# Patient Record
Sex: Female | Born: 1945 | Race: Black or African American | Hispanic: No | Marital: Single | Smoking: Never smoker
Health system: Southern US, Community
[De-identification: ages and names within clinical notes are randomized; demographics above are authoritative.]

## PROBLEM LIST (undated history)

## (undated) DIAGNOSIS — M1990 Unspecified osteoarthritis, unspecified site: Secondary | ICD-10-CM

## (undated) DIAGNOSIS — Z973 Presence of spectacles and contact lenses: Secondary | ICD-10-CM

## (undated) DIAGNOSIS — I10 Essential (primary) hypertension: Secondary | ICD-10-CM

## (undated) DIAGNOSIS — M79672 Pain in left foot: Secondary | ICD-10-CM

## (undated) DIAGNOSIS — Z8679 Personal history of other diseases of the circulatory system: Secondary | ICD-10-CM

## (undated) DIAGNOSIS — R55 Syncope and collapse: Secondary | ICD-10-CM

## (undated) DIAGNOSIS — I471 Supraventricular tachycardia: Secondary | ICD-10-CM

## (undated) DIAGNOSIS — I739 Peripheral vascular disease, unspecified: Secondary | ICD-10-CM

## (undated) DIAGNOSIS — Z87898 Personal history of other specified conditions: Secondary | ICD-10-CM

## (undated) DIAGNOSIS — Z8489 Family history of other specified conditions: Secondary | ICD-10-CM

## (undated) DIAGNOSIS — Z972 Presence of dental prosthetic device (complete) (partial): Secondary | ICD-10-CM

## (undated) DIAGNOSIS — Z86010 Personal history of colonic polyps: Secondary | ICD-10-CM

## (undated) DIAGNOSIS — I4719 Other supraventricular tachycardia: Secondary | ICD-10-CM

## (undated) DIAGNOSIS — J302 Other seasonal allergic rhinitis: Secondary | ICD-10-CM

## (undated) HISTORY — DX: Supraventricular tachycardia: I47.1

## (undated) HISTORY — DX: Syncope and collapse: R55

## (undated) HISTORY — PX: BREAST CYST EXCISION: SHX579

## (undated) HISTORY — DX: Essential (primary) hypertension: I10

## (undated) HISTORY — PX: COLONOSCOPY: SHX174

---
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
## 1999-10-18 HISTORY — PX: ABDOMINAL HYSTERECTOMY: SHX81

## 2002-11-04 ENCOUNTER — Emergency Department (HOSPITAL_COMMUNITY): Admission: EM | Admit: 2002-11-04 | Discharge: 2002-11-04 | Payer: Self-pay

## 2008-10-17 DIAGNOSIS — Z8679 Personal history of other diseases of the circulatory system: Secondary | ICD-10-CM

## 2008-10-17 HISTORY — DX: Personal history of other diseases of the circulatory system: Z86.79

## 2009-06-08 ENCOUNTER — Inpatient Hospital Stay (HOSPITAL_COMMUNITY): Admission: EM | Admit: 2009-06-08 | Discharge: 2009-06-10 | Payer: Self-pay | Admitting: Emergency Medicine

## 2009-06-08 ENCOUNTER — Ambulatory Visit: Payer: Self-pay | Admitting: Cardiology

## 2009-06-09 ENCOUNTER — Encounter: Payer: Self-pay | Admitting: Internal Medicine

## 2009-06-09 ENCOUNTER — Encounter: Payer: Self-pay | Admitting: Cardiology

## 2009-06-10 ENCOUNTER — Ambulatory Visit: Payer: Self-pay | Admitting: Internal Medicine

## 2009-06-11 ENCOUNTER — Telehealth (INDEPENDENT_AMBULATORY_CARE_PROVIDER_SITE_OTHER): Payer: Self-pay | Admitting: *Deleted

## 2009-06-12 ENCOUNTER — Ambulatory Visit: Payer: Self-pay | Admitting: Internal Medicine

## 2009-06-12 DIAGNOSIS — I498 Other specified cardiac arrhythmias: Secondary | ICD-10-CM

## 2009-06-12 DIAGNOSIS — R55 Syncope and collapse: Secondary | ICD-10-CM

## 2009-06-12 DIAGNOSIS — I10 Essential (primary) hypertension: Secondary | ICD-10-CM | POA: Insufficient documentation

## 2009-06-12 DIAGNOSIS — R002 Palpitations: Secondary | ICD-10-CM | POA: Insufficient documentation

## 2009-06-15 ENCOUNTER — Telehealth (INDEPENDENT_AMBULATORY_CARE_PROVIDER_SITE_OTHER): Payer: Self-pay

## 2009-06-16 ENCOUNTER — Ambulatory Visit: Payer: Self-pay

## 2009-06-16 ENCOUNTER — Encounter: Payer: Self-pay | Admitting: Cardiovascular Disease

## 2009-06-17 ENCOUNTER — Ambulatory Visit: Payer: Self-pay | Admitting: Internal Medicine

## 2009-07-09 ENCOUNTER — Ambulatory Visit: Payer: Self-pay | Admitting: Internal Medicine

## 2009-09-14 ENCOUNTER — Telehealth: Payer: Self-pay | Admitting: Internal Medicine

## 2009-09-15 ENCOUNTER — Telehealth (INDEPENDENT_AMBULATORY_CARE_PROVIDER_SITE_OTHER): Payer: Self-pay | Admitting: *Deleted

## 2009-09-21 ENCOUNTER — Telehealth: Payer: Self-pay | Admitting: Internal Medicine

## 2009-11-05 ENCOUNTER — Telehealth: Payer: Self-pay | Admitting: Internal Medicine

## 2009-12-29 ENCOUNTER — Telehealth: Payer: Self-pay | Admitting: Internal Medicine

## 2010-01-21 ENCOUNTER — Telehealth: Payer: Self-pay | Admitting: Internal Medicine

## 2010-03-02 ENCOUNTER — Telehealth: Payer: Self-pay | Admitting: Internal Medicine

## 2010-03-10 ENCOUNTER — Ambulatory Visit: Payer: Self-pay | Admitting: Internal Medicine

## 2010-03-30 ENCOUNTER — Telehealth (INDEPENDENT_AMBULATORY_CARE_PROVIDER_SITE_OTHER): Payer: Self-pay | Admitting: *Deleted

## 2010-04-07 ENCOUNTER — Ambulatory Visit: Payer: Self-pay | Admitting: Internal Medicine

## 2010-08-20 ENCOUNTER — Telehealth: Payer: Self-pay | Admitting: Internal Medicine

## 2010-10-22 ENCOUNTER — Ambulatory Visit
Admission: RE | Admit: 2010-10-22 | Discharge: 2010-10-22 | Payer: Self-pay | Source: Home / Self Care | Attending: Internal Medicine | Admitting: Internal Medicine

## 2010-10-22 ENCOUNTER — Encounter: Payer: Self-pay | Admitting: Internal Medicine

## 2010-11-16 NOTE — Progress Notes (Signed)
Summary: pt rtn call from [facility]  Phone Note Call from Patient Call back at [facility] Phone [phone] Call back at [phone]   Caller: Patient Reason for Call: Talk to Nurse, Talk to Doctor Summary of Call: pt rtn call to [facility] and she will be at home number for the next and then call her at other number listed above Initial call taken by: Omer Jack,  August 20, 2010 9:08 AM  Follow-up for Phone Call        Pt. called  for Dr. Hillery Jacks to authorize a medication . Pt. is taken Toprol XL 50 mg once a day. A prescription was send  to [facility] Toprol XL 50 mg once daily. Pt. aware. Follow-up by: Ollen Gross, RN, BSN,  August 20, 2010 11:20 AM    Prescriptions: TOPROL XL 50 MG XR24H-TAB (METOPROLOL SUCCINATE) take 1 tablets qd  #30 x 8   Entered by:   Ollen Gross, RN, BSN   Authorized by:   Hillis Range, MD   Signed by:   Ollen Gross, RN, BSN on 08/20/2010   Method used:   Electronically to        [facility]* (retail)       [address]       [place], [place]  [address]       Ph: [phone]       Fax: [phone]   RxID:   [id number]

## 2010-11-16 NOTE — Assessment & Plan Note (Signed)
Summary: rov per pt call/lg   Visit Type:  Follow-up Primary Provider:  Dr Tommas Olp   History of Present Illness: The patient presents today for routine electrophysiology followup. She reports doing very well since last being seen in our clinic.  She has returned to normal activities. The patient denies any symptoms of palpitations, chest pain, shortness of breath, orthopnea, PND, lower extremity edema, dizziness, presyncope, syncope, or neurologic sequela. She is not compliant with flecainide.  The patient is tolerating other medications without difficulties and is otherwise without complaint today.   Current Medications (verified): 1)  Aspirin 81 Mg Tbec (Aspirin) .... Take One Tablet By Mouth Daily 2)  Toprol Xl 50 Mg Xr24h-Tab (Metoprolol Succinate) .... Take 1 Tablets Qd 3)  Amlodipine Besylate 5 Mg Tabs (Amlodipine Besylate) .... Once Daily 4)  Vitamin C Cr 500 Mg Cr-Caps (Ascorbic Acid) .... Once Daily 5)  Vitamin D 1000 Unit  Tabs (Cholecalciferol) .... Once Daily 6)  Flecainide Acetate 100 Mg Tabs (Flecainide Acetate) .... One By Mouth Once Daily 7)  Bromelain 375 Mg Caps (Bromelains) .... Take One Tablet By Mouth Once Daily. 8)  Saline Nasal Spray 0.65 % Soln (Saline) .... As Needed  Allergies (verified): No Known Drug Allergies  Past History:  Past Medical History: Reviewed history from 06/17/2009 and no changes required. SUPRAVENTRICULAR TACHYCARDIA (ICD-427.89)- Atach HYPERTENSION (ICD-401.9) SYNCOPE (ICD-780.2)  Past Surgical History: Reviewed history from 06/12/2009 and no changes required.  Status post total vaginal hysterectomy and BSO.      Social History: Reviewed history from 06/12/2009 and no changes required.  She lives in [place] with the daughter.  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  The patient  denies tobacco, alcohol, or drug use.  She does exercise 3 days a week  using elliptical and stationary bicycle or  sometimes treadmill.  She  also reports that she does ab work.   Review of Systems       All systems are reviewed and negative except as listed in the HPI.   Vital Signs:  Patient profile:   65 year old female Height:      67 inches Weight:      164 pounds BMI:     25.78 Pulse rate:   112 / minute BP supine:   122 / 78  Vitals Entered By: Laurance Flatten CMA (Mar 10, 2010 2:26 PM)  Physical Exam  General:  Well developed, well nourished, in no acute distress. Head:  normocephalic and atraumatic Eyes:  PERRLA/EOM intact; conjunctiva and lids normal. Mouth:  Teeth, gums and palate normal. Oral mucosa normal. Neck:  Neck supple, no JVD. No masses, thyromegaly or abnormal cervical nodes. Lungs:  Clear bilaterally to auscultation and percussion. Heart:  tachy regular rhythm , no m/r/g Abdomen:  Bowel sounds positive; abdomen soft and non-tender without masses, organomegaly, or hernias noted. No hepatosplenomegaly. Msk:  Back normal, normal gait. Muscle strength and tone normal. Pulses:  pulses normal in all 4 extremities Extremities:  No clubbing or cyanosis. Neurologic:  Alert and oriented x 3. Skin:  Intact without lesions or rashes. Cervical Nodes:  no significant adenopathy Psych:  Normal affect.  Echocardiogram  Procedure date:  06/09/2009  Findings:       Study Conclusions    1. Left ventricle: The cavity size was mildly dilated. Wall      thickness was normal. The estimated ejection fraction was 50%.      Diffuse  hypokinesis.   2. Aortic valve: Trivial regurgitation.   3. Mitral valve: Mild regurgitation.   4. Left atrium: The atrium was mildly dilated.   5. Right atrium: The atrium was mildly dilated.   6. Atrial septum: No defect or patent foramen ovale was identified.   7. Pulmonary arteries: PA peak pressure: 32mm Hg (S).   Echocardiography. M-mode, complete 2D, spectral Doppler, and color   Doppler. Height: Height: 170.2cm. Height: 67in. Weight: Weight:    68.3kg. Weight: 150.3lb. Body mass index: BMI: 23.6kg/m^2. Body   surface area: BSA: 1.64m^2. Patient status: Inpatient. Location:   Bedside.    EKG  Procedure date:  03/10/2010  Findings:      ectopic atrial tachycardia CL ,  P waves negative in V1, positive in I, II, III, VF  Impression & Recommendations:  Problem # 1:  SUPRAVENTRICULAR TACHYCARDIA (ICD-427.89) The patient presents today in EAT.  She is minimally symptomatic and HR are not significantly elevated.  Unfortunately, she is not compliant with flecainide.  Therapeutic strategies for atrial tachycardia including medicine and ablation were discussed in detail with the patient today. Risk, benefits, and alternatives to EP study and radiofrequency ablation  were also discussed in detail today.  The patient wishes to continue medical therapy at this time.  She reports that she will take her flecainide as prescribed.  She will also take metoprolol for rate control and return for reassessment in 4 wks.  Problem # 2:  HYPERTENSION (ICD-401.9) stable no changes Her updated medication list for this problem includes:    Aspirin 81 Mg Tbec (Aspirin) .Marland Kitchen... Take one tablet by mouth daily    Toprol Xl 50 Mg Xr24h-tab (Metoprolol succinate) .Marland Kitchen... Take 1 tablets qd    Amlodipine Besylate 5 Mg Tabs (Amlodipine besylate) ..... Once daily  Problem # 3:  SYNCOPE (ICD-780.2) no further episodes  Patient Instructions: 1)  Your physician recommends that you schedule a follow-up appointment in: 4 weeks with Dr Johney Frame

## 2010-11-16 NOTE — Progress Notes (Signed)
  Walk in Patient Form Recieved " Pt left papers from [facility]" sent to [facility]  March 30, 2010 8:45 AM

## 2010-11-16 NOTE — Progress Notes (Signed)
Summary: life [employer] letter <lmom>  Phone Note Call from Patient Call back at Home Phone [phone]   Caller: Patient Summary of Call: pt calling about a letter from [employer] Initial call taken by: Migdalia Dk,  December 29, 2009 11:55 AM  Follow-up for Phone Call        Called patient and left message on machine to call back  Sander Nephew, RN 12:04 pm 12/29/2009  returning call, Migdalia Dk  December 29, 2009 12:20 PM   pt received 2nd letter stating monitor was not covered, wanted [place] to be aware she will have to pay, will let Tresa Endo and Dr Johney Frame know Meredith Staggers, RN  December 30, 2009 9:28 AM   Additional Follow-up for Phone Call Additional follow up Details #1::        Called and left pt a message that our office was getting in contact with [employer] and do not pay until she hears back from [place]  Dennis Bast, RN, BSN  December 30, 2009 1:05 PM

## 2010-11-16 NOTE — Assessment & Plan Note (Signed)
Summary: 11:45/ok per nurse/per check out/sf   Primary Provider:  Dr Tommas Olp  CC:  no complaints.  History of Present Illness: The patient presents today for routine electrophysiology followup. She reports doing very well since last being seen in our clinic.  She has returned to normal activities. The patient denies any symptoms of palpitations, chest pain, shortness of breath, orthopnea, PND, lower extremity edema, dizziness, presyncope, syncope, or neurologic sequela. She has resumed flecainide. The patient is tolerating other medications without difficulties and is otherwise without complaint today.   Current Medications (verified): 1)  Aspirin 81 Mg Tbec (Aspirin) .... Take One Tablet By Mouth Daily 2)  Toprol Xl 50 Mg Xr24h-Tab (Metoprolol Succinate) .... Take 1 Tablets Qd 3)  Amlodipine Besylate 5 Mg Tabs (Amlodipine Besylate) .... Once Daily 4)  Vitamin C Cr 500 Mg Cr-Caps (Ascorbic Acid) .... Once Daily 5)  Vitamin D 1000 Unit  Tabs (Cholecalciferol) .... Once Daily 6)  Flecainide Acetate 100 Mg Tabs (Flecainide Acetate) .... One By Mouth Two Times A Day 7)  Bromelain 375 Mg Caps (Bromelains) .... Take One Tablet By Mouth Once Daily. 8)  Saline Nasal Spray 0.65 % Soln (Saline) .... As Needed  Allergies (verified): No Known Drug Allergies  Past History:  Past Medical History: Reviewed history from 06/17/2009 and no changes required. SUPRAVENTRICULAR TACHYCARDIA (ICD-427.89)- Atach HYPERTENSION (ICD-401.9) SYNCOPE (ICD-780.2)  Past Surgical History: Reviewed history from 06/12/2009 and no changes required.  Status post total vaginal hysterectomy and BSO.      Vital Signs:  Patient profile:   65 year old female Height:      67 inches Weight:      169 pounds BMI:     26.56 Pulse rate:   56 / minute Resp:     16 per minute BP sitting:   132 / 76  (left arm)  Vitals Entered By: Marrion Coy, CNA (April 07, 2010 11:28 AM)  Physical Exam  General:  Well  developed, well nourished, in no acute distress. Head:  normocephalic and atraumatic Eyes:  PERRLA/EOM intact; conjunctiva and lids normal. Mouth:  Teeth, gums and palate normal. Oral mucosa normal. Neck:  Neck supple, no JVD. No masses, thyromegaly or abnormal cervical nodes. Lungs:  Clear bilaterally to auscultation and percussion. Heart:  Non-displaced PMI, chest non-tender; regular rate and rhythm, S1, S2 without murmurs, rubs or gallops. Carotid upstroke normal, no bruit. Normal abdominal aortic size, no bruits. Femorals normal pulses, no bruits. Pedals normal pulses. No edema, no varicosities. Abdomen:  Bowel sounds positive; abdomen soft and non-tender without masses, organomegaly, or hernias noted. No hepatosplenomegaly. Msk:  Back normal, normal gait. Muscle strength and tone normal. Pulses:  pulses normal in all 4 extremities Extremities:  No clubbing or cyanosis. Neurologic:  Alert and oriented x 3.   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  She does not wish to proceed with ablation at this time.  We will continue medical therapy at this time.  Problem # 2:  HYPERTENSION (ICD-401.9) stable no changes  Patient Instructions: 1)  Return in 6 months

## 2010-11-16 NOTE — Progress Notes (Signed)
Summary: CALLING ABOUT [employer] BILL  Phone Note Call from Patient Call back at Home Phone [phone]   Caller: Patient Summary of Call: PT CALLING REGARDING THE [employer] MONITOR. Initial call taken by: Judie Grieve,  November 05, 2009 3:23 PM  Follow-up for Phone Call        all info was sent to [facility] via fax on 10/28/09. LMOM for pt regarding information.  She can call back with more questions if she would like Dennis Bast, RN, BSN  November 05, 2009 4:06 PM

## 2010-11-16 NOTE — Progress Notes (Signed)
Summary: pt has form from [employer]...[facility] TCB///js  Phone Note Call from Patient Call back at Home Phone [phone]   Caller: Patient Reason for Call: Talk to Nurse, Talk to Doctor Summary of Call: pt has paperwork from [employer] and she wants to talk to nurse proir to filling the form out because it has been so much trouble Initial call taken by: Omer Jack,  January 21, 2010 1:59 PM  Follow-up for Phone Call        [facility] TCB Follow-up by: Minerva Areola, RN, BSN,  January 21, 2010 4:46 PM  Additional Follow-up for Phone Call Additional follow up Details #1::        returning call, Migdalia Dk  January 22, 2010 9:38 AM  lmom for pt mail me the forms and I would fill out Dennis Bast, RN, BSN  January 22, 2010 4:41 PM

## 2010-11-16 NOTE — Progress Notes (Signed)
Summary: regarding letter, insurance- life [employer]  Phone Note Call from Patient Call back at [facility] Phone [phone]   Caller: Patient Reason for Call: Talk to Nurse Summary of Call: regarding a letter- insurance- and life watch.  Initial call taken by: Lorne Skeens,  Mar 02, 2010 9:02 AM  Follow-up for Phone Call        want her policy information and she doesn't card.  Got copy of card and will mail to pt today.  Pt aware Dennis Bast, RN, BSN  Mar 02, 2010 9:23 AM

## 2010-11-18 NOTE — Assessment & Plan Note (Signed)
Summary: 6 month rov/sl   Visit Type:  Follow-up Primary Provider:  Dr Tommas Olp  CC:  no cardiac complaints.  History of Present Illness: The patient presents today for routine electrophysiology followup. She reports doing very well since last being seen in our clinic.   The patient denies any symptoms of palpitations, chest pain, shortness of breath, orthopnea, PND, lower extremity edema, dizziness, presyncope, syncope, or neurologic sequela. She has resumed flecainide. She reports occasional fatigue and sleepiness with toprol.  The patient is otherwise without complaint today.   Current Medications (verified): 1)  Aspirin 81 Mg Tbec (Aspirin) .... Take One Tablet By Mouth Daily 2)  Toprol Xl 50 Mg Xr24h-Tab (Metoprolol Succinate) .... Take 1 Tablets Qd 3)  Amlodipine Besylate 5 Mg Tabs (Amlodipine Besylate) .... Once Daily 4)  Vitamin C Cr 500 Mg Cr-Caps (Ascorbic Acid) .... Once Daily 5)  Vitamin D 1000 Unit  Tabs (Cholecalciferol) .... Once Daily 6)  Flecainide Acetate 100 Mg Tabs (Flecainide Acetate) .... One By Mouth Two Times A Day 7)  Bromelain 375 Mg Caps (Bromelains) .... Take One Tablet By Mouth Once Daily. 8)  Saline Nasal Spray 0.65 % Soln (Saline) .... As Needed  Allergies: No Known Drug Allergies  Past History:  Past Medical History: Reviewed history from 06/17/2009 and no changes required. SUPRAVENTRICULAR TACHYCARDIA (ICD-427.89)- Atach HYPERTENSION (ICD-401.9) SYNCOPE (ICD-780.2)  Past Surgical History: Reviewed history from 06/12/2009 and no changes required.  Status post total vaginal hysterectomy and BSO.      Social History: Reviewed history from 06/12/2009 and no changes required.  She lives in [place] with the daughter.  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  The patient  denies tobacco, alcohol, or drug use.  She does exercise 3 days a week  using elliptical and stationary bicycle or sometimes treadmill.  She   also reports that she does ab work.   Review of Systems       All systems are reviewed and negative except as listed in the HPI.   Vital Signs:  Patient profile:   65 year old female Height:      67 inches Weight:      171.50 pounds BMI:     26.96 Pulse rate:   61 / minute Pulse rhythm:   regular BP sitting:   132 / 72  (left arm) Cuff size:   regular  Vitals Entered By: Scherrie Bateman, LPN (October 22, 2010 10:54 AM)  Physical Exam  General:  Well developed, well nourished, in no acute distress. Head:  normocephalic and atraumatic Eyes:  PERRLA/EOM intact; conjunctiva and lids normal. Mouth:  Teeth, gums and palate normal. Oral mucosa normal. Neck:  Neck supple, no JVD. No masses, thyromegaly or abnormal cervical nodes. Lungs:  Clear bilaterally to auscultation and percussion. Heart:  Non-displaced PMI, chest non-tender; regular rate and rhythm, S1, S2 without murmurs, rubs or gallops. Carotid upstroke normal, no bruit. Normal abdominal aortic size, no bruits. Femorals normal pulses, no bruits. Pedals normal pulses. No edema, no varicosities. Abdomen:  Bowel sounds positive; abdomen soft and non-tender without masses, organomegaly, or hernias noted. No hepatosplenomegaly. Msk:  Back normal, normal gait. Muscle strength and tone normal. Extremities:  No clubbing or cyanosis. Neurologic:  Alert and oriented x 3. Skin:  Intact without lesions or rashes. Psych:  Normal affect.   EKG  Procedure date:  10/22/2010  Findings:      sinus rhythm 60 bpm,  PR 168. 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  She does not wish to proceed with ablation at this time.  We will continue medical therapy at this time.  I have decreased toprol XL to 25mg  daily.   Problem # 2:  HYPERTENSION (ICD-401.9) stable  Problem # 3:   SYNCOPE (ICD-780.2) no further episodes  Other Orders: EKG w/ Interpretation (93000)  Patient Instructions: 1)  Your physician recommends that you schedule a follow-up appointment in: 6 MONTHS WITH DR Johney Frame 2)  Your physician has recommended you make the following change in your medication: DECREASE TOPROL TO 25 MG once daily  3)  TAKE 1/2 TAB

## 2011-01-22 LAB — COMPREHENSIVE METABOLIC PANEL
ALT: 25 U/L (ref 0–35)
AST: 43 U/L — ABNORMAL HIGH (ref 0–37)
Albumin: 3.3 g/dL — ABNORMAL LOW (ref 3.5–5.2)
CO2: 29 mEq/L (ref 19–32)
Calcium: 9 mg/dL (ref 8.4–10.5)
Chloride: 110 mEq/L (ref 96–112)
GFR calc Af Amer: >60 mL/min (ref >60)
GFR calc non Af Amer: 57 mL/min — ABNORMAL LOW (ref >60)
Sodium: 142 mEq/L (ref 135–145)

## 2011-01-22 LAB — BASIC METABOLIC PANEL
BUN: 7 mg/dL (ref 6–23)
CO2: 27 mEq/L (ref 19–32)
Chloride: 106 mEq/L (ref 96–112)
Creatinine, Ser: 1.15 mg/dL (ref 0.4–1.2)
GFR calc Af Amer: 51 mL/min — ABNORMAL LOW (ref >60)
GFR calc non Af Amer: 43 mL/min — ABNORMAL LOW (ref >60)
Glucose, Bld: 85 mg/dL (ref 70–99)
Potassium: 4.1 mEq/L (ref 3.5–5.1)
Sodium: 136 mEq/L (ref 135–145)

## 2011-01-22 LAB — CBC
MCHC: 33.5 g/dL (ref 30.0–36.0)
MCV: 88.9 fL (ref 78.0–100.0)
Platelets: 219 10*3/uL (ref 150–400)
RDW: 14.6 % (ref 11.5–15.5)

## 2011-01-22 LAB — POCT CARDIAC MARKERS
CKMB, poc: 17.9 ng/mL (ref 1.0–8.0)
Troponin i, poc: <0.05 ng/mL (ref 0.00–0.09)

## 2011-01-22 LAB — PROTIME-INR
INR: 1.1 (ref 0.00–1.49)
Prothrombin Time: 13.6 seconds (ref 11.6–15.2)

## 2011-01-22 LAB — CARDIAC PANEL(CRET KIN+CKTOT+MB+TROPI)
Relative Index: 1.3 (ref 0.0–2.5)
Relative Index: 1.5 (ref 0.0–2.5)
Total CK: 926 U/L — ABNORMAL HIGH (ref 7–177)

## 2011-01-22 LAB — DIFFERENTIAL
Basophils Relative: 0 % (ref 0–1)
Eosinophils Absolute: 0.1 10*3/uL (ref 0.0–0.7)
Neutrophils Relative %: 60 % (ref 43–77)

## 2011-01-22 LAB — CK TOTAL AND CKMB (NOT AT ARMC)
CK, MB: 32.6 ng/mL — ABNORMAL HIGH (ref 0.3–4.0)
Total CK: 1344 U/L — ABNORMAL HIGH (ref 7–177)

## 2011-03-01 NOTE — Discharge Summary (Signed)
NAME:  [name] [name]            ACCOUNT NO.:  [id number]   MEDICAL RECORD NO.:  [id number]          PATIENT TYPE:  INP   LOCATION:  2030                         FACILITY:  [facility]   PHYSICIAN:  Hillis Range, MD       DATE OF BIRTH:  Jun 05, 1946   DATE OF ADMISSION:  06/08/2009  DATE OF DISCHARGE:  06/10/2009                               DISCHARGE SUMMARY   This patient has no known drug allergies.   FINAL DIAGNOSES:  1. Admitted with syncope.  2. Telemetry at [facility] shows frequent premature atrial      contractions, frequent premature ventricular contractions and      paroxysmal long RP tachycardia.  3. The patient's cardiac dysrhythmias suppressed with Toprol succinate      75 mg daily.  4. Admitted with elevated blood pressures, Norvasc 5 mg daily added      for blood pressure control.  5. A 2-D echocardiogram on June 09, 2009, ejection fraction 50% mild      mitral regurgitation, mild tricuspid regurgitation.   SECONDARY DIAGNOSES:  1. Status post total abdominal hysterectomy.  2. Hypertension.   PROCEDURES THIS ADMISSION:  None.  The patient had medical management of  her supraventricular tachycardia.   Also, because of history of syncope, the patient is advised not to drive  for the next 6 months.   For brief history, [name] [name] is a 65 year old female.  She recently  increased her exercise regimen, but notes that she has had more easy  fatigability lately.   On Sunday, August 23, the patient felt anorexic, this is not her usual  state, lately she has been having hot flashes breaking out in sweats  without accompanying increase in activity.  The patient is status post  hysterectomy.   Monday morning, August 24, the patient was getting ready for work.  In  the shower, she felt nauseated and the need to evacuate.  After passing  a loose stool, she went downstairs for breakfast.  As she was preparing  after breakfast to go back upstairs, she felt  clammy and woozy.  She sat  down for a while to recover and the next thing she remembers is  awakening upstairs, having fallen from the stairwell into her daughter's  room.  The patient does not elicit any history of palpitations, chest  pain, or dyspnea.  After the fall, she reports feeling weak.  Her  daughter was awakened by the fall and noted that the patient had no  postictal symptoms, was not particularly confused.   Later in the day, the patient went to Urgent Care.  Electrocardiogram  showed frequent PACs and PVCs.  The patient was sent to the emergency  room.  In the emergency room, her daughter who is at the examination  during this consult saw the patient's monitor vacillate from 60-150  beats per minute.  Monitors here show SVT, a long, RP tachycardia, this  is paroxysmal.  She also has ventricular bigeminy, frequent PACs, and  frequent PVCs.  She has had some suppression of ectopy on Toprol 50 mg  daily.  The  patient prior to this admission was on 25 mg daily.  She was  also hypertensive on presentation and has been started on Norvasc.   HOSPITAL COURSE:  The patient admitted on August 24 with syncope.  The  patient had frequent ectopy on monitor here as well as hypertension.  Her ectopy has been suppressed by increasing Toprol in a staged fashion  to the point where she is taking now 75 mg daily, and has no PACs or  PVCs and just an occasional paroxysmal occurrence of her long RP  tachycardia.  The patient had cardiac enzymes taken.  They were negative  x3.  Her TSH is within normal limits at 1.350.  She had a 2-D  echocardiogram, which showed ejection fraction 50%, mild tricuspid  regurgitation.  She had orthostatic blood pressure measurements taken,  which showed no decrease from lying to standing on her blood pressure.  The patient during her episodes of SVT is relatively asymptomatic.  She  is not aware of palpitations, chest discomfort, or shortness of breath.  There  is some thought that the patient's syncope was a vasovagal event.  The patient has been once again encouraged not to drive for the next 6  months.   DISCHARGE MEDICATIONS:  1. Addition of aspirin 325 mg daily.  2. Amlodipine 5 mg daily.  3. Metoprolol succinate 75 mg daily.  4. Ascorbic acid 500 mg daily.  5. Bromaline 1 tablet daily as needed.  6. Omega-3-ethyl esters 1 g daily.  7. Vitamin D3 1000 units 2 tablets daily.  8. The patient is asked to stop taking Toprol 25 mg daily.   She has followup at [facility].  1. To pick up a [employer] monitor on her way home from the hospital      today.  2. Stress test, Tuesday, August 31 at 10 o'clock.  She is asked to eat      nothing after midnight, Monday, August 30.  3. To see Dr. Johney Frame in followup, Wednesday, September 22 at 9:30.   LABORATORY STUDIES THIS ADMISSION:  Sodium 142, potassium 3.3, which was  replenished, chloride 110, carbonate 29, BUN is 5, creatinine 0.98, and  glucose 104.  TSH 1.350.  D-dimer 1.53.  No followup CT since the  patient clinically did not warrant that her SGOT was 43, SGPT 25,  alkaline and phosphatase 67.  White cells 5.5, hemoglobin 14.4,  hematocrit 43, platelets 219.  Troponin I studies 0.04, then 0.03, and  then 0.02.      Maple Mirza, [place]      Hillis Range, MD  Electronically Signed    GM/MEDQ  D:  06/10/2009  T:  06/10/2009  Job:  [id number]   cc:   Sabino Dick, M.D.

## 2011-03-01 NOTE — Consult Note (Signed)
NAME:  [name] [name], [name] [name]            ACCOUNT NO.:  [id number]   MEDICAL RECORD NO.:  [id number]          PATIENT TYPE:  INP   LOCATION:  2030                         FACILITY:  [facility]   PHYSICIAN:  Hillis Range, MD       DATE OF BIRTH:  January 03, 1946   DATE OF CONSULTATION:  DATE OF DISCHARGE:                                 CONSULTATION   REQUESTING PHYSICIAN:  Arturo Morton. Riley Kill, MD, [facility]   REASON FOR CONSULTATION:  Supraventricular tachycardia.   HISTORY OF PRESENT ILLNESS:  [name] [name] is a pleasant 65 year old  female with a history of hypertension, who presented following a  syncopal episode.  She reports being in her usual state of health until  June 08, 2009, in the early morning when she awoke with symptoms of  nausea.  She took a shower and then started having nausea and pallor.  She did not throw up, but did have a large loose watery stool.  She  reports being very weak and clammy.  She was then ambulating up her  steps when she had a syncopal episode with brief loss of consciousness.  She denies palpitations, chest pain, or other symptoms preceding her  episode.  Upon regaining composure, she reports that her nausea,  vomiting, and abdominal discomfort had resolved.  She presented to  urgent care and was subsequently brought to [facility] for further  evaluation.  At [facility], the patient was placed on telemetry where  she was observed to have supraventricular tachycardia with heart rates  into the 150s.  She reports symptoms of similar palpitations in the  past, though these have previously been well controlled with Toprol-XL.  She presently feels at her baseline state of health.  She denies recent  episodes of chest pain, shortness of breath, orthopnea, PND, or other  concerns.   PAST MEDICAL HISTORY:  Hypertension.   PAST SURGICAL HISTORY:  Status post total abdominal hysterectomy.   ALLERGIES:  No known drug allergies.   HOME MEDICATIONS:  Toprol-XL 25 mg  daily.   CURRENT MEDICATIONS:  Reviewed in the [facility].   SOCIAL HISTORY:  The patient lives in [place] with her daughter.  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  She denies tobacco, alcohol, or drug  use.   FAMILY HISTORY:  The patient's son has had DVTs.  This has been  attributed to a potentially familial hypercoagulability, though this is  felt to have been inherited through the patient's father who also had  hypercoagulability.  The patient denies any other relatives with  hypercoagulability.  Her mother had a stroke at age 20.  Her father died  of COPD and coronary artery disease.  She is unaware of any history of  sudden death or arrhythmias.   REVIEW OF SYSTEMS:  All systems are reviewed and negative except as  outlined in the HPI above.   Telemetry reveals predominantly sinus rhythm.  The patient did have a  long RP tachycardia with heart rates into the 150s.  She has also had  premature atrial contractions and PVCs.   PHYSICAL EXAMINATION:  VITALS:  Blood pressure 115/68, heart rate 61,  respirations 18, sats 98% on room air, afebrile.  GENERAL:  The patient is a well-appearing female in no acute distress.  She is alert and oriented x3.  HEENT:  Normocephalic, atraumatic.  Sclerae clear.  Conjunctivae pink.  Oropharynx clear.  NECK:  Supple.  No thyromegaly, JVD, or bruits.  LUNGS:  Clear to auscultation bilaterally.  HEART:  Regular rate and rhythm.  No murmurs, rubs, or gallops.  GI:  Soft, nontender, nondistended.  Positive bowel sounds.  EXTREMITIES:  No clubbing, cyanosis, or edema.  NEUROLOGIC:  Strength and sensation are intact.  SKIN:  No ecchymoses or lacerations.  MUSCULOSKELETAL:  No deformity or atrophy.  PSYCHIATRIC:  Cyclothymic mood.  Full affect.   EKG from June 08, 2009, at 2253 reveals a 2:1 tachycardia.  The  tachycardia cycle length is 410 milliseconds.  The ventricular rate is  70 beats per minute.  The P-wave morphology is very similar to a  sinus P-  wave, but is likely an ectopic focus.  This could possibly, however,  represent sinus node reentry.  A second EKG from June 09, 2009,  reveals sinus rhythm at 60 beats per minute with an incomplete right  bundle branch block pattern, otherwise unremarkable.   LABORATORY DATA:  Sodium 142, potassium 3.3, and creatinine 0.98.  Liver  profile unremarkable.  TSH 1.350.  White blood cell count 5.5,  hematocrit 43, platelets 219, D-dimer 1.53.   Echocardiogram from earlier today reveals a left ventricular ejection  fraction of 50% with diffuse hypokinesis.   IMPRESSION:  [name] [name] is a 65 year old female who was admitted  following a syncopal episode.  Her syncopal episode appears to be  vasovagal by history.  Her symptoms have completely resolved.  On  telemetry in the hospital, the patient has been observed to have  supraventricular tachycardia of the long RP variety.  This tachycardia  likely represents an atrial tachycardia, but it could also be a  reentrant mechanism.   PLAN:  1. For the patient's syncope, I would recommend orthostatics.      Adequate hydration is also recommended.  I think at discharge, it      would be helpful to place a 21-day event monitor.  2. For the patient's supraventricular tachycardia, she appears to have      minimal episodes of SVT at home in the past.  I would, therefore,      recommend that we continue medical therapies at this time unless      she has a very large burden of SVT during the remainder of her      hospital stay.  I would, therefore, increase Toprol-XL as      tolerated.  I do not recommend anticoagulation for her arrhythmia      at this time.  Should she have increasing episodes of SVT with      symptoms, then we will consider EP study and possible ablation at      that time.      Hillis Range, MD  Electronically Signed    JA/MEDQ  D:  06/09/2009  T:  06/10/2009  Job:  [id number]   cc:   Arturo Morton. Riley Kill, MD, [facility]   Dr. Charmian Muff

## 2011-03-01 NOTE — H&P (Signed)
NAME:  [name] [name], [name] [name]            ACCOUNT NO.:  [id number]   MEDICAL RECORD NO.:  [id number]          PATIENT TYPE:  EMS   LOCATION:  MAJO                         FACILITY:  [facility]   PHYSICIAN:  Thomas C. Wall, MD, FACCDATE OF BIRTH:  08/28/1946   DATE OF ADMISSION:  06/08/2009  DATE OF DISCHARGE:                              HISTORY & PHYSICAL   PRIMARY CARDIOLOGIST:  [facility], being seen by Dr. Daleen Squibb.   PRIMARY CARE Carolanne Mercier:  Sabino Dick, MD.   PATIENT PROFILE:  A 65 year old African American female who presented to  the [facility] ED following syncopal episode problems.  1. Syncope.  2. History of hypertension.  3. History of palpitations.  4. Status post total vaginal hysterectomy and BSO.   ALLERGIES:  No known drug allergies.   HISTORY OF PRESENT ILLNESS:  A 65 year old African American female  without prior cardiac history.  Today, she awoke at 6 a.m. and felt  lightheaded, nauseated, diaphoretic.  She had denied chest pain and  dyspnea.  She went to use the bathroom and had a loose stool.  She  continued to feel lightheaded then lie down in bed and started to feel a  little bit better.  She then went downstairs to eat some breakfast and  when she was returning back upstairs, she acutely felt lightheaded  again.  She did make it up the stairs, but at the top of the stairs she  apparently lost consciousness and fell into her daughter's bedroom from  the hallway.  The patient had no recollection of falling, in any  evidence she does not remember getting to the top of the stairs.  She  thinks that she may have fallen backwards because her back is slightly  uncomfortable on the left side.  She denies any headaches.  There is no  loss of bowel or bladder function.  Her daughter heard her fall, quickly  attended to her, and the patient was immediately responsive and even  reported feeling better.  She had no additional lightheadedness at that  point.  She sat  around for a good portion of the day and then at 2:30  went to Urgent Care just to be evaluated.  At Urgent Care, an ECG was  performed and she was noted to have ectopy both ventricular and atrial  and she was sent to monitor in to the ED after the urgent care physician  spoke with Dr. Eden Emms.  Here, she has no complaints.  She is  hypertensive with pressure has been in the 200.  Notably, she has  negative troponin, although her CK-MB is elevated at 17.9.  EKG shows  sinus rhythm with no acute ST-T changes, although she has had a fair  amount of atrial and ventricular ectopy, which has been asymptomatic.   ALLERGIES:  No known drug allergies.   HOME MEDICATIONS:  Toprol-XL 25 mg daily.   FAMILY HISTORY:  Mother is aged 45 with a history of CVA.  Father died  of unknown age of COPD and MI.  She has 3 sister and 2 brothers who are  alive and  well.   SOCIAL HISTORY:  She lives in [place] with the daughter.  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  She is widowed.  The patient  denies tobacco, alcohol, or drug use.  She does exercise 3 days a week  using elliptical and stationary bicycle or sometimes treadmill.  She  also reports that she does ab work.   REVIEW OF SYSTEMS:  Positive for syncope as outlined above.  She had  nausea with loose stool this morning.  She denies any chest pain or  shortness of breath.  Otherwise, all systems revived and are negative.  She is full code.   PHYSICAL EXAMINATION:  VITAL SIGNS:  Temperature 98.8, heart rate 72,  respirations 18, blood pressure was 140/107 on admission, now ranging in  the 170s-200 over 80s-90s.  GENERAL:  She is a pleasant African American female in no acute  distress.  Awake, alert, and oriented x3.  She has normal affect.  HEENT:  Normal.  NEUROLOGIC:  Grossly intact and nonfocal.  SKIN:  Warm and dry without lesions or masses.  MUSCULOSKELETAL:  Grossly normal without deformity or effusion.  NECK:  Supple  without bruits or JVD.  LUNGS:  Respirations regular, unlabored.  Clear to auscultation.  CARDIAC:  Regular S1 and S2.  No S3, S4, or murmurs.  ABDOMEN:  Round, soft, nontender, nondistended.  Bowel sounds present  x4.  EXTREMITIES:  Warm, dry, and pink.  No clubbing, cyanosis, or edema.  Dorsalis pedis, posterior tibial pulses 2+ and equal bilaterally.   Chest x-ray shows no acute disease.  EKG shows sinus rhythm, rate of 67,  left atrial enlargement.  Review of telemetry does show runs of SVT/PAT  as well as ventricular bigeminy.  This has been asymptomatic.  Hemoglobin 14.4, hematocrit 43.0, WBC 5.5, platelets 219.  Sodium 140,  potassium 4.6, chloride 106, CO2 of 27, BUN 7, creatinine 1.15, glucose  101, CK-MB 17.9, troponin-I less than 0.05.   ASSESSMENT AND PLAN:  1. Syncope.  The patient was lightheaded with diaphoresis, nausea, and      also loose stool.  She subsequently suffered a syncopal episode for      a very brief period per her report.  Question vasovagal in the      setting of possible GI illness or other arrhythmogenic syncope.      She does have a fair amount atrial and ventricular ectopy here      including brief runs of SVT and ventricular bigeminy, although this      has been asymptomatic.  Electrolytes within normal limits.      Notably, she has a normal troponin, but her CK-MB is elevated by      point of care markers.  We will plan to admit and cycle cardiac      markers.  We will check a 2-D echocardiogram in the morning.  We      will titrate a beta-blocker at 50 mg daily and check a TSH as well      as D-dimer.  2. Hypertension.  The patient is markedly hypertensive here.  She says      blood pressure normally runs in the 140s at home.  We will titrate      Toprol as above and also add calcium-channel blocker.  We will      write for p.r.n. hydralazine.    Dictation ended at this point     Nicolasa Ducking, ANP  Thomas C. Daleen Squibb, MD, [facility]   Electronically Signed   CB/MEDQ  D:  06/08/2009  T:  06/09/2009  Job:  [id number]

## 2011-03-04 NOTE — Letter (Signed)
September 30, 2009    [employer] [place] [facility] Department, Level 2  [address] [address]  [place], [place] [address]   Fax Number [phone]   RE:  AVIENDHA, AZBELL  MRN:  [id number]  /  DOB:  1946/04/11   To Whom It May Concern:   I am writing regarding request for an appeal for reimbursement of an  event monitor placed for my patient [name] [name] (DOB 1945-12-11).  Ms. Mcmackin is a very pleasant female who was admitted to [facility] from August 23 through August 25 with a complaint of syncope.  During her hospital stay, she was found to have frequent premature  atrial contractions, frequent premature ventricular contractions, as  well as supraventricular tachycardia.  She was placed on metoprolol.  Due to her new onset and unexplained syncope as well as atrial  arrhythmias, it was felt prudent to have the patient wear an event  monitor for further evaluation.  The event monitor was very helpful in  determining effective treatment for her atrial arrhythmias and to  exclude malignant arrhythmias.  I think that having the event monitor  placed was an essential part of her health care and minimized her  hospital length of stay.  I would appreciate your reconsideration of  payment for her event monitor.  I would be happy to discuss this with  you further.  If you would reconsider reimbursement as part of your  appeals process and I would greatly appreciate this.    Sincerely,      Hillis Range, MD  Electronically Signed    JA/MedQ  DD: 09/30/2009  DT: 10/01/2009  Job #: [id number]

## 2011-03-23 ENCOUNTER — Encounter: Payer: Self-pay | Admitting: Internal Medicine

## 2011-04-21 ENCOUNTER — Ambulatory Visit (INDEPENDENT_AMBULATORY_CARE_PROVIDER_SITE_OTHER): Payer: Medicare Other | Admitting: Internal Medicine

## 2011-04-21 ENCOUNTER — Encounter: Payer: Self-pay | Admitting: Internal Medicine

## 2011-04-21 DIAGNOSIS — I10 Essential (primary) hypertension: Secondary | ICD-10-CM

## 2011-04-21 DIAGNOSIS — E785 Hyperlipidemia, unspecified: Secondary | ICD-10-CM | POA: Insufficient documentation

## 2011-04-21 DIAGNOSIS — I498 Other specified cardiac arrhythmias: Secondary | ICD-10-CM

## 2011-04-21 NOTE — Progress Notes (Signed)
The patient presents today for routine electrophysiology followup.  Since last being seen in our clinic, the patient reports doing very well.  Today, she denies symptoms of palpitations, chest pain, shortness of breath, orthopnea, PND, lower extremity edema, dizziness, presyncope, syncope, or neurologic sequela.  The patient feels that she is tolerating medications without difficulties and is otherwise without complaint today.   Past Medical History  Diagnosis Date  . Supraventricular tachycardia     likely atrial tachycardia, well controlled with flecainide  . Hypertension   . Syncope     vasovagal in etiology, without recurrence   No past surgical history on file.  Current Outpatient Prescriptions  Medication Sig Dispense Refill  . amLODipine (NORVASC) 5 MG tablet Take 5 mg by mouth daily.        . Ascorbic Acid (VITAMIN C CR) 500 MG TBCR Take 1 tablet by mouth daily.        Marland Kitchen aspirin 81 MG tablet Take 81 mg by mouth daily.        . Bromelain 375 MG CAPS Take 1 capsule by mouth daily.        . cholecalciferol (VITAMIN D) 1000 UNITS tablet Take 1,000 Units by mouth daily.       . fish oil-omega-3 fatty acids 1000 MG capsule Take 2 g by mouth daily.        . flecainide (TAMBOCOR) 100 MG tablet Take 100 mg by mouth 2 (two) times daily.        . metoprolol (TOPROL-XL) 50 MG 24 hr tablet Take 50 mg by mouth daily.        . sodium chloride (OCEAN) 0.65 % nasal spray Place 1 spray into the nose as needed.        Marland Kitchen DISCONTD: HYDROcodone-acetaminophen (VICODIN) 5-500 MG per tablet       . DISCONTD: ibuprofen (ADVIL,MOTRIN) 800 MG tablet         No Known Allergies  History   Social History  . Marital Status: Single    Spouse Name: N/A    Number of Children: N/A  . Years of Education: N/A   Occupational History  . 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 Smoking status: Never Smoker   . Smokeless tobacco: Not on file  . Alcohol Use: No  . Drug Use:  No  . Sexually Active: Not on file   Other Topics Concern  . Not on file   Social History Narrative   She lives in [place] with her daughter. She does exercise 3 days a week using elliptical and stationary bicycle or sometimes treadmill. She also reports that she does ab work.    Family History  Problem Relation Age of Onset  . COPD Father   . Heart attack Father   . Stroke Mother     ROS-  All systems are reviewed and are negative except as outlined in the HPI above    Physical Exam: Filed Vitals:   04/21/11 1349  BP: 127/56  Height: 5\' 6"  (1.676 m)  Weight: 173 lb (78.472 kg)    GEN- The patient is well appearing, alert and oriented x 3 today.   Head- normocephalic, atraumatic Eyes-  Sclera clear, conjunctiva pink Ears- hearing intact Oropharynx- clear Neck- supple, no JVP Lymph- no cervical lymphadenopathy Lungs- Clear to ausculation bilaterally, normal work of breathing Heart- Regular rate and rhythm, no murmurs, rubs or gallops, PMI not laterally displaced GI-  soft, NT, ND, + BS Extremities- no clubbing, cyanosis, or edema MS- no significant deformity or atrophy Skin- no rash or lesion Psych- euthymic mood, full affect Neuro- strength and sensation are intact  ekg today reveals sinus bradycardia 56 bpm, PR 170, QRS 100, Qtc 424,  Labs from PCP:  Tchol 234, TG 56, HDL 99, LDL 124  Assessment and Plan:

## 2011-04-21 NOTE — Assessment & Plan Note (Signed)
Controlled. No changes today. 

## 2011-04-21 NOTE — Assessment & Plan Note (Signed)
Mildly elevated Very robust HDL.  She would like to avoid statin drugs at this time. No changes today.

## 2011-04-21 NOTE — Assessment & Plan Note (Signed)
Stable No change required today  

## 2011-07-04 ENCOUNTER — Other Ambulatory Visit: Payer: Self-pay | Admitting: Internal Medicine

## 2011-12-22 ENCOUNTER — Telehealth: Payer: Self-pay | Admitting: Internal Medicine

## 2011-12-22 NOTE — Telephone Encounter (Signed)
New problem:  Patient calling  the cost of flecainide is  $ 25.00 more. Need a level two cost that much cheaper.

## 2011-12-22 NOTE — Telephone Encounter (Signed)
Spoke with patient and let her know that most AA drugs will be the same price  Flecainide is a Tier II.  It has just gone up in price  I told her she could talk with Dr Johney Frame at her follow up appointment

## 2012-03-28 ENCOUNTER — Other Ambulatory Visit: Payer: Self-pay | Admitting: Internal Medicine

## 2012-03-28 MED ORDER — FLECAINIDE ACETATE 100 MG PO TABS: 100.0000 mg | ORAL_TABLET | Freq: Two times a day (BID) | ORAL | Status: DC

## 2012-04-23 ENCOUNTER — Encounter: Payer: Self-pay | Admitting: Internal Medicine

## 2012-04-23 ENCOUNTER — Ambulatory Visit (INDEPENDENT_AMBULATORY_CARE_PROVIDER_SITE_OTHER): Payer: Medicare Other | Admitting: Internal Medicine

## 2012-04-23 VITALS — BP 130/88 | HR 76 | Resp 18 | Ht 67.0 in | Wt 179.1 lb

## 2012-04-23 DIAGNOSIS — I471 Supraventricular tachycardia: Secondary | ICD-10-CM

## 2012-04-23 DIAGNOSIS — I498 Other specified cardiac arrhythmias: Secondary | ICD-10-CM

## 2012-04-23 DIAGNOSIS — E785 Hyperlipidemia, unspecified: Secondary | ICD-10-CM

## 2012-04-23 DIAGNOSIS — I10 Essential (primary) hypertension: Secondary | ICD-10-CM

## 2012-04-23 MED ORDER — FLECAINIDE ACETATE 100 MG PO TABS: 50.0000 mg | ORAL_TABLET | Freq: Two times a day (BID) | ORAL | Status: DC

## 2012-04-23 NOTE — Assessment & Plan Note (Signed)
She says that her tchol was 234 at her PCPs office recently. She is reluctant to take statins but will consider red yeast rice. No changes today

## 2012-04-23 NOTE — Assessment & Plan Note (Signed)
Stable No change required today  

## 2012-04-23 NOTE — Patient Instructions (Addendum)
Decrease Flecainide dose to 50mg  twice daily and then if in 2 months you are ok, stop it completely.  Your physician wants you to follow-up in: 1 year with Dr. Johney Frame.  You will receive a reminder letter in the mail two months in advance. If you don't receive a letter, please call our office to schedule the follow-up appointment.

## 2012-04-23 NOTE — Assessment & Plan Note (Signed)
Well controlled Decrease flecainide to 50mg  BID If no further SVT, she can stop flecainide in 2 months

## 2012-04-23 NOTE — Progress Notes (Signed)
PCP:  [facility])  [name] [name] is a 66 y.o. female who presents today for routine electrophysiology followup.  Since last being seen in our clinic, the patient reports doing very well.   She denies any further tachycardia. Today, she denies symptoms of palpitations, chest pain, shortness of breath,  lower extremity edema, dizziness, presyncope, or syncope.  The patient is otherwise without complaint today.   Past Medical History  Diagnosis Date  . Supraventricular tachycardia     likely atrial tachycardia, well controlled with flecainide  . Hypertension   . Syncope     vasovagal in etiology, without recurrence   No past surgical history on file.  Current Outpatient Prescriptions  Medication Sig Dispense Refill  . amLODipine (NORVASC) 5 MG tablet Take 5 mg by mouth daily.        . Ascorbic Acid (VITAMIN C CR) 500 MG TBCR Take 1 tablet by mouth daily.        Marland Kitchen azelastine (ASTELIN) 137 MCG/SPRAY nasal spray       . Bromelain 375 MG CAPS Take 1 capsule by mouth daily.        . cholecalciferol (VITAMIN D) 1000 UNITS tablet Take 1,000 Units by mouth daily.       . clobetasol cream (TEMOVATE) 0.05 % Apply 1 application topically 2 (two) times daily.       . fish oil-omega-3 fatty acids 1000 MG capsule Take 2 g by mouth daily.        . flecainide (TAMBOCOR) 100 MG tablet Take 0.5 tablets (50 mg total) by mouth 2 (two) times daily.  60 tablet  1  . NON FORMULARY Black cherry juice.daily.      . sodium chloride (OCEAN) 0.65 % nasal spray Place 1 spray into the nose as needed.        Marland Kitchen DISCONTD: flecainide (TAMBOCOR) 100 MG tablet Take 1 tablet (100 mg total) by mouth 2 (two) times daily.  60 tablet  1  . aspirin 81 MG tablet Take 81 mg by mouth daily.        . metoprolol (TOPROL-XL) 50 MG 24 hr tablet Take 50 mg by mouth daily.        . montelukast (SINGULAIR) 10 MG tablet         Physical Exam: Filed Vitals:   04/23/12 1552  BP: 130/88  Pulse: 76    Resp: 18  Height: 5\' 7"  (1.702 m)  Weight: 179 lb 1.9 oz (81.248 kg)  SpO2: 96%    GEN- The patient is well appearing, alert and oriented x 3 today.   Head- normocephalic, atraumatic Eyes-  Sclera clear, conjunctiva pink Ears- hearing intact Oropharynx- clear Lungs- Clear to ausculation bilaterally, normal work of breathing Heart- Regular rate and rhythm, no murmurs, rubs or gallops, PMI not laterally displaced GI- soft, NT, ND, + BS Extremities- no clubbing, cyanosis, or edema  ekg today reveal sinus rhythm 65 bpm, PR 178, otherwise normal ekg  Assessment and Plan:

## 2013-04-16 DIAGNOSIS — Z860100 Personal history of colon polyps, unspecified: Secondary | ICD-10-CM

## 2013-04-16 HISTORY — DX: Personal history of colonic polyps: Z86.010

## 2013-04-16 HISTORY — PX: COLONOSCOPY: SHX174

## 2013-04-16 HISTORY — DX: Personal history of colon polyps, unspecified: Z86.0100

## 2013-04-23 ENCOUNTER — Other Ambulatory Visit: Payer: Self-pay | Admitting: Nurse Practitioner

## 2013-04-23 DIAGNOSIS — N63 Unspecified lump in unspecified breast: Secondary | ICD-10-CM

## 2013-04-24 ENCOUNTER — Encounter: Payer: Self-pay | Admitting: Internal Medicine

## 2013-04-24 ENCOUNTER — Ambulatory Visit (INDEPENDENT_AMBULATORY_CARE_PROVIDER_SITE_OTHER): Payer: Medicare Other | Admitting: Internal Medicine

## 2013-04-24 VITALS — BP 148/80 | HR 69 | Ht 68.0 in | Wt 176.2 lb

## 2013-04-24 DIAGNOSIS — I498 Other specified cardiac arrhythmias: Secondary | ICD-10-CM

## 2013-04-24 DIAGNOSIS — R55 Syncope and collapse: Secondary | ICD-10-CM

## 2013-04-24 DIAGNOSIS — I10 Essential (primary) hypertension: Secondary | ICD-10-CM

## 2013-04-24 NOTE — Patient Instructions (Addendum)
Your physician recommends that you schedule a follow-up appointment in: AS NEEDED  Your physician recommends that you continue on your current medications as directed. Please refer to the Current Medication list given to you today.  

## 2013-04-24 NOTE — Progress Notes (Signed)
PCP:  [facility])  [name] [name] is a 67 y.o. female who presents today for routine electrophysiology followup.  Since last being seen in our clinic, the patient reports doing very well.   She denies any further tachycardia. Today, she denies symptoms of palpitations, chest pain, shortness of breath,  lower extremity edema, dizziness, presyncope, or syncope.  The patient is otherwise without complaint today.   Past Medical History  Diagnosis Date  . Supraventricular tachycardia     likely atrial tachycardia, well controlled with flecainide  . Hypertension   . Syncope     vasovagal in etiology, without recurrence   No past surgical history on file.  Current Outpatient Prescriptions  Medication Sig Dispense Refill  . amLODipine (NORVASC) 5 MG tablet Take 5 mg by mouth daily.        . Ascorbic Acid (VITAMIN C CR) 500 MG TBCR Take 1 tablet by mouth daily.        Marland Kitchen aspirin 81 MG tablet Take 81 mg by mouth daily.        . Bromelain 375 MG CAPS Take 1 capsule by mouth daily.        . cholecalciferol (VITAMIN D) 1000 UNITS tablet Take 1,000 Units by mouth daily.       . clobetasol cream (TEMOVATE) 0.05 % Apply 1 application topically 2 (two) times daily. As needed      . fish oil-omega-3 fatty acids 1000 MG capsule Take 2 g by mouth daily.        . montelukast (SINGULAIR) 10 MG tablet       . NON FORMULARY Black cherry juice.daily.      . sodium chloride (OCEAN) 0.65 % nasal spray Place 1 spray into the nose as needed.         No current facility-administered medications for this visit.    Physical Exam: Filed Vitals:   04/24/13 1529  BP: 148/80  Pulse: 69  Height: 5\' 8"  (1.727 m)  Weight: 176 lb 3.2 oz (79.924 kg)    GEN- The patient is well appearing, alert and oriented x 3 today.   Head- normocephalic, atraumatic Eyes-  Sclera clear, conjunctiva pink Ears- hearing intact Oropharynx- clear Lungs- Clear to ausculation bilaterally, normal  work of breathing Heart- Regular rate and rhythm, no murmurs, rubs or gallops, PMI not laterally displaced GI- soft, NT, ND, + BS Extremities- no clubbing, cyanosis, or edema  ekg today reveal sinus rhythm with PACs 69 bpm, PR 178, otherwise normal ekg  Assessment and Plan:  1. SVT No recurrence off of flecainide No further workup planned  2. Syncope- resolved  3. HTN Repeat by MD is 124/80 No changes  Return as needed

## 2013-05-03 ENCOUNTER — Other Ambulatory Visit: Payer: Medicare Other

## 2013-05-07 ENCOUNTER — Other Ambulatory Visit: Payer: Self-pay | Admitting: Nurse Practitioner

## 2013-05-07 ENCOUNTER — Ambulatory Visit
Admission: RE | Admit: 2013-05-07 | Discharge: 2013-05-07 | Disposition: A | Discharge status: Home / Self Care | Payer: Medicare Other | Source: Ambulatory Visit | Attending: Nurse Practitioner | Admitting: Nurse Practitioner

## 2013-05-07 DIAGNOSIS — R921 Mammographic calcification found on diagnostic imaging of breast: Secondary | ICD-10-CM

## 2013-05-07 DIAGNOSIS — N63 Unspecified lump in unspecified breast: Secondary | ICD-10-CM

## 2013-05-17 ENCOUNTER — Ambulatory Visit (INDEPENDENT_AMBULATORY_CARE_PROVIDER_SITE_OTHER): Payer: Medicare Other | Admitting: General Surgery

## 2013-05-17 ENCOUNTER — Encounter (INDEPENDENT_AMBULATORY_CARE_PROVIDER_SITE_OTHER): Payer: Self-pay | Admitting: General Surgery

## 2013-05-17 VITALS — BP 132/80 | HR 64 | Temp 98.4°F | Resp 14 | Ht 68.0 in | Wt 178.0 lb

## 2013-05-17 DIAGNOSIS — D242 Benign neoplasm of left breast: Secondary | ICD-10-CM

## 2013-05-17 DIAGNOSIS — D249 Benign neoplasm of unspecified breast: Secondary | ICD-10-CM

## 2013-05-17 NOTE — Patient Instructions (Signed)
Plan for left breast wire localized lumpectomy 

## 2013-05-17 NOTE — Progress Notes (Signed)
Subjective:     Patient ID: [name] [name], female   DOB: 06-23-1946, 67 y.o.   MRN: [id number]  HPI We are asked to see the patient in consultation by Dr. Charmian Muff to evaluate her for a left breast intraductal papilloma. The patient is a 67 year old black female who recently went for a routine screening mammogram. At that time she was found to have some abnormal calcifications in the left breast. These were biopsied and came back as an intraductal papilloma. She denies any breast pain or discharge from her nipple. She has no personal or family history of breast cancer.  Review of Systems  Constitutional: Negative.   HENT: Negative.   Eyes: Negative.   Respiratory: Negative.   Cardiovascular: Negative.   Gastrointestinal: Negative.   Endocrine: Negative.   Genitourinary: Negative.   Musculoskeletal: Negative.   Skin: Negative.   Allergic/Immunologic: Negative.   Neurological: Negative.   Hematological: Negative.   Psychiatric/Behavioral: Negative.        Objective:   Physical Exam  Constitutional: She is oriented to person, place, and time. She appears well-developed and well-nourished.  HENT:  Head: Normocephalic and atraumatic.  Eyes: Conjunctivae and EOM are normal. Pupils are equal, round, and reactive to light.  Neck: Normal range of motion. Neck supple.  Cardiovascular: Normal rate, regular rhythm and normal heart sounds.   Pulmonary/Chest: Effort normal and breath sounds normal.  There is no palpable mass in either breast. There is no palpable axillary, cervical, or supraclavicular lymphadenopathy  Abdominal: Soft. Bowel sounds are normal. She exhibits no mass. There is no tenderness.  Musculoskeletal: Normal range of motion.  Lymphadenopathy:    She has no cervical adenopathy.  Neurological: She is alert and oriented to person, place, and time.  Skin: Skin is warm and dry.  Psychiatric: She has a normal mood and affect. Her behavior is normal.       Assessment:     The patient appears to have an intraductal papilloma in the left breast. Because of the abnormal calcifications and because the diagnosis of a papilloma slightly increases her risk of breast cancer I think it would be reasonable to re\re excise that area. I've discussed with her in detail the risk and benefits of the operation and it is as well as some of the technical aspects and she understands and wishes to proceed     Plan:     Plan for left breast wire localized lumpectomy

## 2013-05-20 ENCOUNTER — Encounter (HOSPITAL_COMMUNITY): Payer: Self-pay

## 2013-05-22 ENCOUNTER — Ambulatory Visit (HOSPITAL_COMMUNITY)
Admission: RE | Admit: 2013-05-22 | Discharge: 2013-05-22 | Disposition: A | Discharge status: Home / Self Care | Payer: Medicare Other | Source: Ambulatory Visit | Attending: Anesthesiology | Admitting: Anesthesiology

## 2013-05-22 ENCOUNTER — Encounter (HOSPITAL_COMMUNITY)
Admission: RE | Admit: 2013-05-22 | Discharge: 2013-05-22 | Disposition: A | Discharge status: Home / Self Care | Payer: Medicare Other | Source: Ambulatory Visit | Attending: General Surgery | Admitting: General Surgery

## 2013-05-22 ENCOUNTER — Encounter (HOSPITAL_COMMUNITY): Payer: Self-pay

## 2013-05-22 HISTORY — DX: Personal history of colonic polyps: Z86.010

## 2013-05-22 HISTORY — DX: Family history of other specified conditions: Z84.89

## 2013-05-22 HISTORY — DX: Unspecified osteoarthritis, unspecified site: M19.90

## 2013-05-22 LAB — CBC
HCT: 40.1 % (ref 36.0–46.0)
Hemoglobin: 13.3 g/dL (ref 12.0–15.0)
MCV: 87 fL (ref 78.0–100.0)
RBC: 4.61 MIL/uL (ref 3.87–5.11)
RDW: 12.4 % (ref 11.5–15.5)
WBC: 3.4 10*3/uL — ABNORMAL LOW (ref 4.0–10.5)

## 2013-05-22 LAB — BASIC METABOLIC PANEL
CO2: 28 mEq/L (ref 19–32)
Chloride: 103 mEq/L (ref 96–112)
Creatinine, Ser: 1.03 mg/dL (ref 0.50–1.10)
Potassium: 4.1 mEq/L (ref 3.5–5.1)

## 2013-05-22 MED ORDER — CHLORHEXIDINE GLUCONATE 4 % EX LIQD: 1.0000 "application " | Freq: Once | CUTANEOUS | Status: DC

## 2013-05-22 MED ORDER — CEFAZOLIN SODIUM-DEXTROSE 2-3 GM-% IV SOLR
2.0000 g | INTRAVENOUS | Status: AC
  Administered 2013-05-23: 2 g via INTRAVENOUS
  Filled 2013-05-22: qty 50

## 2013-05-22 NOTE — Pre-Procedure Instructions (Signed)
[name] [name]  05/22/2013   Your procedure is scheduled on:  Thursday, August 7th. Needle localization at [facility] @ 1030. Pt to arrive after procedure  Report to [facility] at - AM.  Call this number if you have problems the morning of surgery: [phone]   Remember:   Do not eat food or drink liquids after midnight. Tonight    Take these medicines the morning of surgery with A SIP OF WATER: amLODipine (NORVASC).              Stop taking Aspirin, Coumadin, Plavix, Effient and Herbal medications.  Do not take any NSAIDs ie: Ibuprofen,  Advil,Naproxen or any medication containing Aspirin.    Do not wear jewelry, make-up or nail polish.  Do not wear lotions, powders, or perfumes. You may wear deodorant.  Do not shave 48 hours prior to surgery.   Do not bring valuables to the hospital.  [facility] is not responsible   for any belongings or valuables.  Contacts, dentures or bridgework may not be worn into surgery.  Leave suitcase in the car. After surgery it may be brought to your room.  For patients admitted to the hospital, checkout time is 11:00 AM the day of discharge.   Patients discharged the day of surgery will not be allowed to drive home.  Name and phone number of your driver: - [name] [name] , daughter   Special Instructions: Shower with CHG wash (Bactoshield) tonight and again in the am prior to arriving to hospital.   Please read over the following fact sheets that you were given: Pain Booklet, Coughing and Deep Breathing and Surgical Site Infection Prevention

## 2013-05-23 ENCOUNTER — Ambulatory Visit (HOSPITAL_COMMUNITY): Payer: Medicare Other | Admitting: Anesthesiology

## 2013-05-23 ENCOUNTER — Encounter (HOSPITAL_COMMUNITY): Payer: Self-pay | Admitting: Anesthesiology

## 2013-05-23 ENCOUNTER — Encounter (HOSPITAL_COMMUNITY)
Admission: RE | Disposition: A | Discharge status: Home / Self Care | Payer: Self-pay | Source: Ambulatory Visit | Attending: General Surgery

## 2013-05-23 ENCOUNTER — Ambulatory Visit (HOSPITAL_COMMUNITY)
Admission: RE | Admit: 2013-05-23 | Discharge: 2013-05-23 | Disposition: A | Discharge status: Home / Self Care | Payer: Medicare Other | Source: Ambulatory Visit | Attending: General Surgery | Admitting: General Surgery

## 2013-05-23 ENCOUNTER — Ambulatory Visit
Admission: RE | Admit: 2013-05-23 | Discharge: 2013-05-23 | Disposition: A | Discharge status: Home / Self Care | Payer: Medicare Other | Source: Ambulatory Visit | Attending: General Surgery | Admitting: General Surgery

## 2013-05-23 DIAGNOSIS — D242 Benign neoplasm of left breast: Secondary | ICD-10-CM

## 2013-05-23 DIAGNOSIS — I10 Essential (primary) hypertension: Secondary | ICD-10-CM | POA: Insufficient documentation

## 2013-05-23 DIAGNOSIS — N6089 Other benign mammary dysplasias of unspecified breast: Secondary | ICD-10-CM

## 2013-05-23 DIAGNOSIS — I498 Other specified cardiac arrhythmias: Secondary | ICD-10-CM | POA: Insufficient documentation

## 2013-05-23 DIAGNOSIS — N6019 Diffuse cystic mastopathy of unspecified breast: Secondary | ICD-10-CM | POA: Insufficient documentation

## 2013-05-23 DIAGNOSIS — D249 Benign neoplasm of unspecified breast: Secondary | ICD-10-CM | POA: Insufficient documentation

## 2013-05-23 HISTORY — PX: BREAST LUMPECTOMY WITH NEEDLE LOCALIZATION: SHX5759

## 2013-05-23 SURGERY — BREAST LUMPECTOMY WITH NEEDLE LOCALIZATION
Anesthesia: General | Site: Breast | Laterality: Left | Wound class: Clean

## 2013-05-23 MED ORDER — HYDROCODONE-ACETAMINOPHEN 5-325 MG PO TABS: 1.0000 | ORAL_TABLET | Freq: Four times a day (QID) | ORAL | Status: DC | PRN

## 2013-05-23 MED ORDER — ONDANSETRON HCL 4 MG/2ML IJ SOLN
INTRAMUSCULAR | Status: DC | PRN
  Administered 2013-05-23: 4 mg via INTRAVENOUS

## 2013-05-23 MED ORDER — LIDOCAINE HCL (CARDIAC) 20 MG/ML IV SOLN
INTRAVENOUS | Status: DC | PRN
  Administered 2013-05-23: 65 mg via INTRAVENOUS

## 2013-05-23 MED ORDER — LACTATED RINGERS IV SOLN
INTRAVENOUS | Status: DC
  Administered 2013-05-23 (×2): via INTRAVENOUS

## 2013-05-23 MED ORDER — PHENYLEPHRINE HCL 10 MG/ML IJ SOLN
INTRAMUSCULAR | Status: DC | PRN
  Administered 2013-05-23: 80 ug via INTRAVENOUS

## 2013-05-23 MED ORDER — BUPIVACAINE-EPINEPHRINE 0.25% -1:200000 IJ SOLN
INTRAMUSCULAR | Status: DC | PRN
  Administered 2013-05-23: 23 mL

## 2013-05-23 MED ORDER — HYDROMORPHONE HCL PF 1 MG/ML IJ SOLN: 0.2500 mg | INTRAMUSCULAR | Status: DC | PRN

## 2013-05-23 MED ORDER — PROPOFOL 10 MG/ML IV BOLUS
INTRAVENOUS | Status: DC | PRN
  Administered 2013-05-23: 170 mg via INTRAVENOUS

## 2013-05-23 MED ORDER — 0.9 % SODIUM CHLORIDE (POUR BTL) OPTIME
TOPICAL | Status: DC | PRN
  Administered 2013-05-23: 1000 mL

## 2013-05-23 MED ORDER — FENTANYL CITRATE 0.05 MG/ML IJ SOLN
INTRAMUSCULAR | Status: DC | PRN
  Administered 2013-05-23 (×2): 50 ug via INTRAVENOUS

## 2013-05-23 MED ORDER — MIDAZOLAM HCL 5 MG/5ML IJ SOLN
INTRAMUSCULAR | Status: DC | PRN
  Administered 2013-05-23: 2 mg via INTRAVENOUS

## 2013-05-23 MED ORDER — BUPIVACAINE-EPINEPHRINE PF 0.25-1:200000 % IJ SOLN
INTRAMUSCULAR | Status: AC
  Filled 2013-05-23: qty 30

## 2013-05-23 MED ORDER — EPHEDRINE SULFATE 50 MG/ML IJ SOLN
INTRAMUSCULAR | Status: DC | PRN
  Administered 2013-05-23: 10 mg via INTRAVENOUS
  Administered 2013-05-23: 5 mg via INTRAVENOUS
  Administered 2013-05-23: 10 mg via INTRAVENOUS

## 2013-05-23 SURGICAL SUPPLY — 46 items
APPLIER CLIP 9.375 MED OPEN (MISCELLANEOUS)
BINDER BREAST LRG (GAUZE/BANDAGES/DRESSINGS) IMPLANT
BINDER BREAST XLRG (GAUZE/BANDAGES/DRESSINGS) IMPLANT
BLADE SURG 10 STRL SS (BLADE) ×2 IMPLANT
BLADE SURG 15 STRL LF DISP TIS (BLADE) ×1 IMPLANT
BLADE SURG 15 STRL SS (BLADE) ×1
CANISTER SUCTION 2500CC (MISCELLANEOUS) IMPLANT
CHLORAPREP W/TINT 26ML (MISCELLANEOUS) ×2 IMPLANT
CLIP APPLIE 9.375 MED OPEN (MISCELLANEOUS) IMPLANT
CLOTH BEACON ORANGE TIMEOUT ST (SAFETY) ×2 IMPLANT
CONT SPEC 4OZ CLIKSEAL STRL BL (MISCELLANEOUS) ×2 IMPLANT
COVER SURGICAL LIGHT HANDLE (MISCELLANEOUS) ×2 IMPLANT
DERMABOND ADVANCED (GAUZE/BANDAGES/DRESSINGS) ×1
DERMABOND ADVANCED .7 DNX12 (GAUZE/BANDAGES/DRESSINGS) ×1 IMPLANT
DEVICE DUBIN SPECIMEN MAMMOGRA (MISCELLANEOUS) ×2 IMPLANT
DRAPE CHEST BREAST 15X10 FENES (DRAPES) ×2 IMPLANT
DRAPE UTILITY 15X26 W/TAPE STR (DRAPE) ×4 IMPLANT
ELECT COATED BLADE 2.86 ST (ELECTRODE) ×2 IMPLANT
ELECT REM PT RETURN 9FT ADLT (ELECTROSURGICAL) ×2
ELECTRODE REM PT RTRN 9FT ADLT (ELECTROSURGICAL) ×1 IMPLANT
GLOVE BIO SURGEON STRL SZ7.5 (GLOVE) ×4 IMPLANT
GLOVE BIOGEL PI IND STRL 7.0 (GLOVE) ×1 IMPLANT
GLOVE BIOGEL PI IND STRL 7.5 (GLOVE) ×1 IMPLANT
GLOVE BIOGEL PI INDICATOR 7.0 (GLOVE) ×1
GLOVE BIOGEL PI INDICATOR 7.5 (GLOVE) ×1
GLOVE SURG SS PI 7.0 STRL IVOR (GLOVE) ×2 IMPLANT
GOWN STRL NON-REIN LRG LVL3 (GOWN DISPOSABLE) ×6 IMPLANT
KIT BASIN OR (CUSTOM PROCEDURE TRAY) ×2 IMPLANT
KIT MARKER MARGIN INK (KITS) IMPLANT
KIT ROOM TURNOVER OR (KITS) ×2 IMPLANT
NEEDLE HYPO 25GX1X1/2 BEV (NEEDLE) ×2 IMPLANT
NS IRRIG 1000ML POUR BTL (IV SOLUTION) ×2 IMPLANT
PACK SURGICAL SETUP 50X90 (CUSTOM PROCEDURE TRAY) ×2 IMPLANT
PAD ARMBOARD 7.5X6 YLW CONV (MISCELLANEOUS) ×2 IMPLANT
PENCIL BUTTON HOLSTER BLD 10FT (ELECTRODE) ×2 IMPLANT
SPONGE LAP 18X18 X RAY DECT (DISPOSABLE) ×2 IMPLANT
SUT MNCRL AB 4-0 PS2 18 (SUTURE) ×2 IMPLANT
SUT MON AB 4-0 PC3 18 (SUTURE) ×2 IMPLANT
SUT SILK 2 0 SH (SUTURE) ×2 IMPLANT
SUT VIC AB 3-0 SH 18 (SUTURE) ×2 IMPLANT
SYR BULB 3OZ (MISCELLANEOUS) ×2 IMPLANT
SYR CONTROL 10ML LL (SYRINGE) ×4 IMPLANT
TOWEL OR 17X24 6PK STRL BLUE (TOWEL DISPOSABLE) IMPLANT
TOWEL OR 17X26 10 PK STRL BLUE (TOWEL DISPOSABLE) ×2 IMPLANT
TUBE CONNECTING 12X1/4 (SUCTIONS) ×2 IMPLANT
YANKAUER SUCT BULB TIP NO VENT (SUCTIONS) ×2 IMPLANT

## 2013-05-23 NOTE — Preoperative (Signed)
Beta Blockers   Reason not to administer Beta Blockers:Not Applicable 

## 2013-05-23 NOTE — Op Note (Signed)
05/23/2013  1:49 PM  PATIENT:  [name] [name]  66 y.o. female  PRE-OPERATIVE DIAGNOSIS:  LEFT BREAST PAPILLOMA  POST-OPERATIVE DIAGNOSIS:  LEFT BREAST PAPILLOMA  PROCEDURE:  Procedure(s): BREAST LUMPECTOMY WITH NEEDLE LOCALIZATION (Left)  SURGEON:  Surgeon(s) and Role:    * Robyne Askew, MD - Primary  PHYSICIAN ASSISTANT:   ASSISTANTS: none   ANESTHESIA:   general  EBL:  Total I/O In: 1000 [I.V.:1000] Out: -   BLOOD ADMINISTERED:none  DRAINS: none   LOCAL MEDICATIONS USED:  MARCAINE     SPECIMEN:  Source of Specimen:  left breast tissue and deep margin  DISPOSITION OF SPECIMEN:  PATHOLOGY  COUNTS:  YES  TOURNIQUET:  * No tourniquets in log *  DICTATION: .Dragon Dictation After informed consent was obtained the patient was brought to the operating room and placed in the supine position on the operating room table. After adequate induction of general anesthesia the patient's left breast was prepped with ChloraPrep, allowed to dry, and draped in usual sterile manner. Earlier in the day the patient underwent a wire localization procedure and the wire was entering the left breast medially and headed deep. A small elliptical incision was made around the wire with a 15 blade knife. This incision was carried through the skin and subcutaneous tissue sharply with electrocautery. Once into the breast tissues the path of the wire could be palpated. A circular portion of breast tissue was excised sharply around the path of the wire. Once the specimen was removed it was oriented with a short stitch on the superior surface and a long stitch on the lateral surface. A specimen radiograph showed the clip to be in the specimen. The radiologist did not see the small calcifications that should have been just deep to the wire. The specimen was sent to pathology. An additional deep margin was taken from the cavity sharply with the electrocautery. This was marked with a stitch on the true deep  surface of the specimen. This was also sent to pathology for further evaluation. Hemostasis was achieved using the Bovie electrocautery. The wound was irrigated with copious amounts of saline and infiltrated with quarter percent Marcaine. The deep layer the wound was closed with interrupted 3-0 Vicryl stitches. The skin was closed with interrupted 4-0 Monocryl subcuticular stitches. Dermabond dressings were applied. The patient tolerated the procedure well. At the end of the case on needle sponge and instrument counts were correct. The patient was then awakened and taken to recovery in stable condition.  PLAN OF CARE: Discharge to home after PACU  PATIENT DISPOSITION:  PACU - hemodynamically stable.   Delay start of Pharmacological VTE agent (>24hrs) due to surgical blood loss or risk of bleeding: not applicable

## 2013-05-23 NOTE — Anesthesia Procedure Notes (Signed)
Procedure Name: LMA Insertion Date/Time: 05/23/2013 12:54 PM Performed by: Sharlene Dory E Pre-anesthesia Checklist: Patient identified, Emergency Drugs available, Suction available, Patient being monitored and Timeout performed Patient Re-evaluated:Patient Re-evaluated prior to inductionOxygen Delivery Method: Circle system utilized Preoxygenation: Pre-oxygenation with 100% oxygen Intubation Type: IV induction Ventilation: Mask ventilation without difficulty LMA: LMA inserted LMA Size: 4.0 Number of attempts: 1 Placement Confirmation: positive ETCO2 and breath sounds checked- equal and bilateral Tube secured with: Tape Dental Injury: Teeth and Oropharynx as per pre-operative assessment

## 2013-05-23 NOTE — Anesthesia Preprocedure Evaluation (Addendum)
Anesthesia Evaluation  Patient identified by MRN, date of birth, ID band Patient awake    Reviewed: Allergy & Precautions, H&P , NPO status , Patient's Chart, lab work & pertinent test results  History of Anesthesia Complications (+) PONV and Family history of anesthesia reaction  Airway Mallampati: II      Dental  (+) Edentulous Upper and Partial Lower   Pulmonary neg pulmonary ROS,  breath sounds clear to auscultation        Cardiovascular hypertension, Pt. on medications + dysrhythmias Supra Ventricular Tachycardia Rhythm:Regular Rate:Normal     Neuro/Psych    GI/Hepatic negative GI ROS, Neg liver ROS,   Endo/Other  negative endocrine ROS  Renal/GU negative Renal ROS     Musculoskeletal   Abdominal   Peds  Hematology   Anesthesia Other Findings   Reproductive/Obstetrics                        Anesthesia Physical Anesthesia Plan  ASA: II  Anesthesia Plan: General   Post-op Pain Management:    Induction: Intravenous  Airway Management Planned: LMA  Additional Equipment:   Intra-op Plan:   Post-operative Plan: Extubation in OR  Informed Consent: I have reviewed the patients History and Physical, chart, labs and discussed the procedure including the risks, benefits and alternatives for the proposed anesthesia with the patient or authorized representative who has indicated his/her understanding and acceptance.   Dental advisory given  Plan Discussed with: CRNA, Anesthesiologist and Surgeon  Anesthesia Plan Comments:       Anesthesia Quick Evaluation

## 2013-05-23 NOTE — H&P (View-Only) (Signed)
Subjective:     Patient ID: [name] [name], female   DOB: 03/06/1946, 67 y.o.   MRN: [id number]  HPI We are asked to see the patient in consultation by Dr. Brewer to evaluate her for a left breast intraductal papilloma. The patient is a 67-year-old black female who recently went for a routine screening mammogram. At that time she was found to have some abnormal calcifications in the left breast. These were biopsied and came back as an intraductal papilloma. She denies any breast pain or discharge from her nipple. She has no personal or family history of breast cancer.  Review of Systems  Constitutional: Negative.   HENT: Negative.   Eyes: Negative.   Respiratory: Negative.   Cardiovascular: Negative.   Gastrointestinal: Negative.   Endocrine: Negative.   Genitourinary: Negative.   Musculoskeletal: Negative.   Skin: Negative.   Allergic/Immunologic: Negative.   Neurological: Negative.   Hematological: Negative.   Psychiatric/Behavioral: Negative.        Objective:   Physical Exam  Constitutional: She is oriented to person, place, and time. She appears well-developed and well-nourished.  HENT:  Head: Normocephalic and atraumatic.  Eyes: Conjunctivae and EOM are normal. Pupils are equal, round, and reactive to light.  Neck: Normal range of motion. Neck supple.  Cardiovascular: Normal rate, regular rhythm and normal heart sounds.   Pulmonary/Chest: Effort normal and breath sounds normal.  There is no palpable mass in either breast. There is no palpable axillary, cervical, or supraclavicular lymphadenopathy  Abdominal: Soft. Bowel sounds are normal. She exhibits no mass. There is no tenderness.  Musculoskeletal: Normal range of motion.  Lymphadenopathy:    She has no cervical adenopathy.  Neurological: She is alert and oriented to person, place, and time.  Skin: Skin is warm and dry.  Psychiatric: She has a normal mood and affect. Her behavior is normal.       Assessment:     The patient appears to have an intraductal papilloma in the left breast. Because of the abnormal calcifications and because the diagnosis of a papilloma slightly increases her risk of breast cancer I think it would be reasonable to re\re excise that area. I've discussed with her in detail the risk and benefits of the operation and it is as well as some of the technical aspects and she understands and wishes to proceed     Plan:     Plan for left breast wire localized lumpectomy       

## 2013-05-23 NOTE — Interval H&P Note (Signed)
History and Physical Interval Note:  05/23/2013 12:31 PM  [name] [name]  has presented today for surgery, with the diagnosis of LEFT BREAST PAPILLOMA  The various methods of treatment have been discussed with the patient and family. After consideration of risks, benefits and other options for treatment, the patient has consented to  Procedure(s): BREAST LUMPECTOMY WITH NEEDLE LOCALIZATION (Left) as a surgical intervention .  The patient's history has been reviewed, patient examined, no change in status, stable for surgery.  I have reviewed the patient's chart and labs.  Questions were answered to the patient's satisfaction.     TOTH III,Geriann Lafont S

## 2013-05-23 NOTE — Anesthesia Postprocedure Evaluation (Signed)
  Anesthesia Post-op Note  Patient: [name] [name]  Procedure(s) Performed: Procedure(s): BREAST LUMPECTOMY WITH NEEDLE LOCALIZATION (Left)  Patient Location: PACU  Anesthesia Type:General  Level of Consciousness: awake, alert , oriented and patient cooperative  Airway and Oxygen Therapy: Patient Spontanous Breathing  Post-op Pain: mild  Post-op Assessment: Post-op Vital signs reviewed, Patient's Cardiovascular Status Stable, Respiratory Function Stable, Patent Airway, No signs of Nausea or vomiting and Pain level controlled  Post-op Vital Signs: stable  Complications: No apparent anesthesia complications

## 2013-05-23 NOTE — Transfer of Care (Signed)
Immediate Anesthesia Transfer of Care Note  Patient: [name] [name]  Procedure(s) Performed: Procedure(s): BREAST LUMPECTOMY WITH NEEDLE LOCALIZATION (Left)  Patient Location: PACU  Anesthesia Type:General  Level of Consciousness: sedated  Airway & Oxygen Therapy: Patient Spontanous Breathing and Patient connected to nasal cannula oxygen  Post-op Assessment: Report given to PACU RN and Post -op Vital signs reviewed and stable  Post vital signs: Reviewed and stable  Complications: No apparent anesthesia complications

## 2013-05-27 ENCOUNTER — Encounter (HOSPITAL_COMMUNITY): Payer: Self-pay | Admitting: General Surgery

## 2013-05-28 ENCOUNTER — Telehealth (INDEPENDENT_AMBULATORY_CARE_PROVIDER_SITE_OTHER): Payer: Self-pay

## 2013-05-28 NOTE — Telephone Encounter (Signed)
Message copied by Brennan Bailey on Tue May 28, 2013  2:00 PM ------      Message from: Caleen Essex III      Created: Tue May 28, 2013  8:22 AM       All benign. No atypia or cancer ------

## 2013-05-28 NOTE — Telephone Encounter (Signed)
I called patient and let her know path came back benign. She will follow up at scheduled appt.

## 2013-06-05 ENCOUNTER — Encounter (INDEPENDENT_AMBULATORY_CARE_PROVIDER_SITE_OTHER): Payer: Self-pay | Admitting: General Surgery

## 2013-06-05 ENCOUNTER — Ambulatory Visit (INDEPENDENT_AMBULATORY_CARE_PROVIDER_SITE_OTHER): Payer: Medicare Other | Admitting: General Surgery

## 2013-06-05 VITALS — BP 130/70 | HR 84 | Resp 14 | Ht 68.0 in | Wt 177.2 lb

## 2013-06-05 DIAGNOSIS — D242 Benign neoplasm of left breast: Secondary | ICD-10-CM

## 2013-06-05 DIAGNOSIS — D249 Benign neoplasm of unspecified breast: Secondary | ICD-10-CM

## 2013-06-05 NOTE — Patient Instructions (Signed)
Continue regular self exams Continue yearly mammograms

## 2013-06-05 NOTE — Progress Notes (Signed)
Subjective:     Patient ID: [name] [name], female   DOB: Dec 25, 1945, 67 y.o.   MRN: [id number]  HPI The patient is a 67 year old black female who is 2 weeks status post left breast lumpectomy for a papilloma, usual ductal hyperplasia, and fibrocystic disease. She tolerated her surgery well. She denies any breast pain.  Review of Systems     Objective:   Physical Exam On exam her left breast incision is healing nicely with no sign of infection or significant seroma    Assessment:     The patient is 2 weeks status post left breast lumpectomy for benign disease     Plan:     At this point she will continue to do regular self exams. She will need to continue to followup with her medical doctor for yearly mammograms. We will plan to see her back on a when necessary basis

## 2013-07-16 ENCOUNTER — Encounter (INDEPENDENT_AMBULATORY_CARE_PROVIDER_SITE_OTHER): Payer: Self-pay

## 2014-04-10 ENCOUNTER — Other Ambulatory Visit (HOSPITAL_COMMUNITY): Payer: Self-pay | Admitting: Nurse Practitioner

## 2014-04-10 DIAGNOSIS — Z1231 Encounter for screening mammogram for malignant neoplasm of breast: Secondary | ICD-10-CM

## 2014-04-22 ENCOUNTER — Other Ambulatory Visit: Payer: Self-pay | Admitting: Nurse Practitioner

## 2014-04-22 DIAGNOSIS — Z1231 Encounter for screening mammogram for malignant neoplasm of breast: Secondary | ICD-10-CM

## 2014-05-07 ENCOUNTER — Ambulatory Visit
Admission: RE | Admit: 2014-05-07 | Discharge: 2014-05-07 | Disposition: A | Discharge status: Home / Self Care | Payer: Medicare Other | Source: Ambulatory Visit | Attending: Nurse Practitioner | Admitting: Nurse Practitioner

## 2014-05-07 ENCOUNTER — Encounter (INDEPENDENT_AMBULATORY_CARE_PROVIDER_SITE_OTHER): Payer: Self-pay

## 2014-05-07 DIAGNOSIS — Z1231 Encounter for screening mammogram for malignant neoplasm of breast: Secondary | ICD-10-CM

## 2014-11-23 IMAGING — CR DG CHEST 2V
2 series · 2 of 2 positions shown · non-contrast
Comparison: 06/08/2009

CLINICAL DATA: Preop left lumpectomy

CHEST - 2 VIEW

[w chest pa]
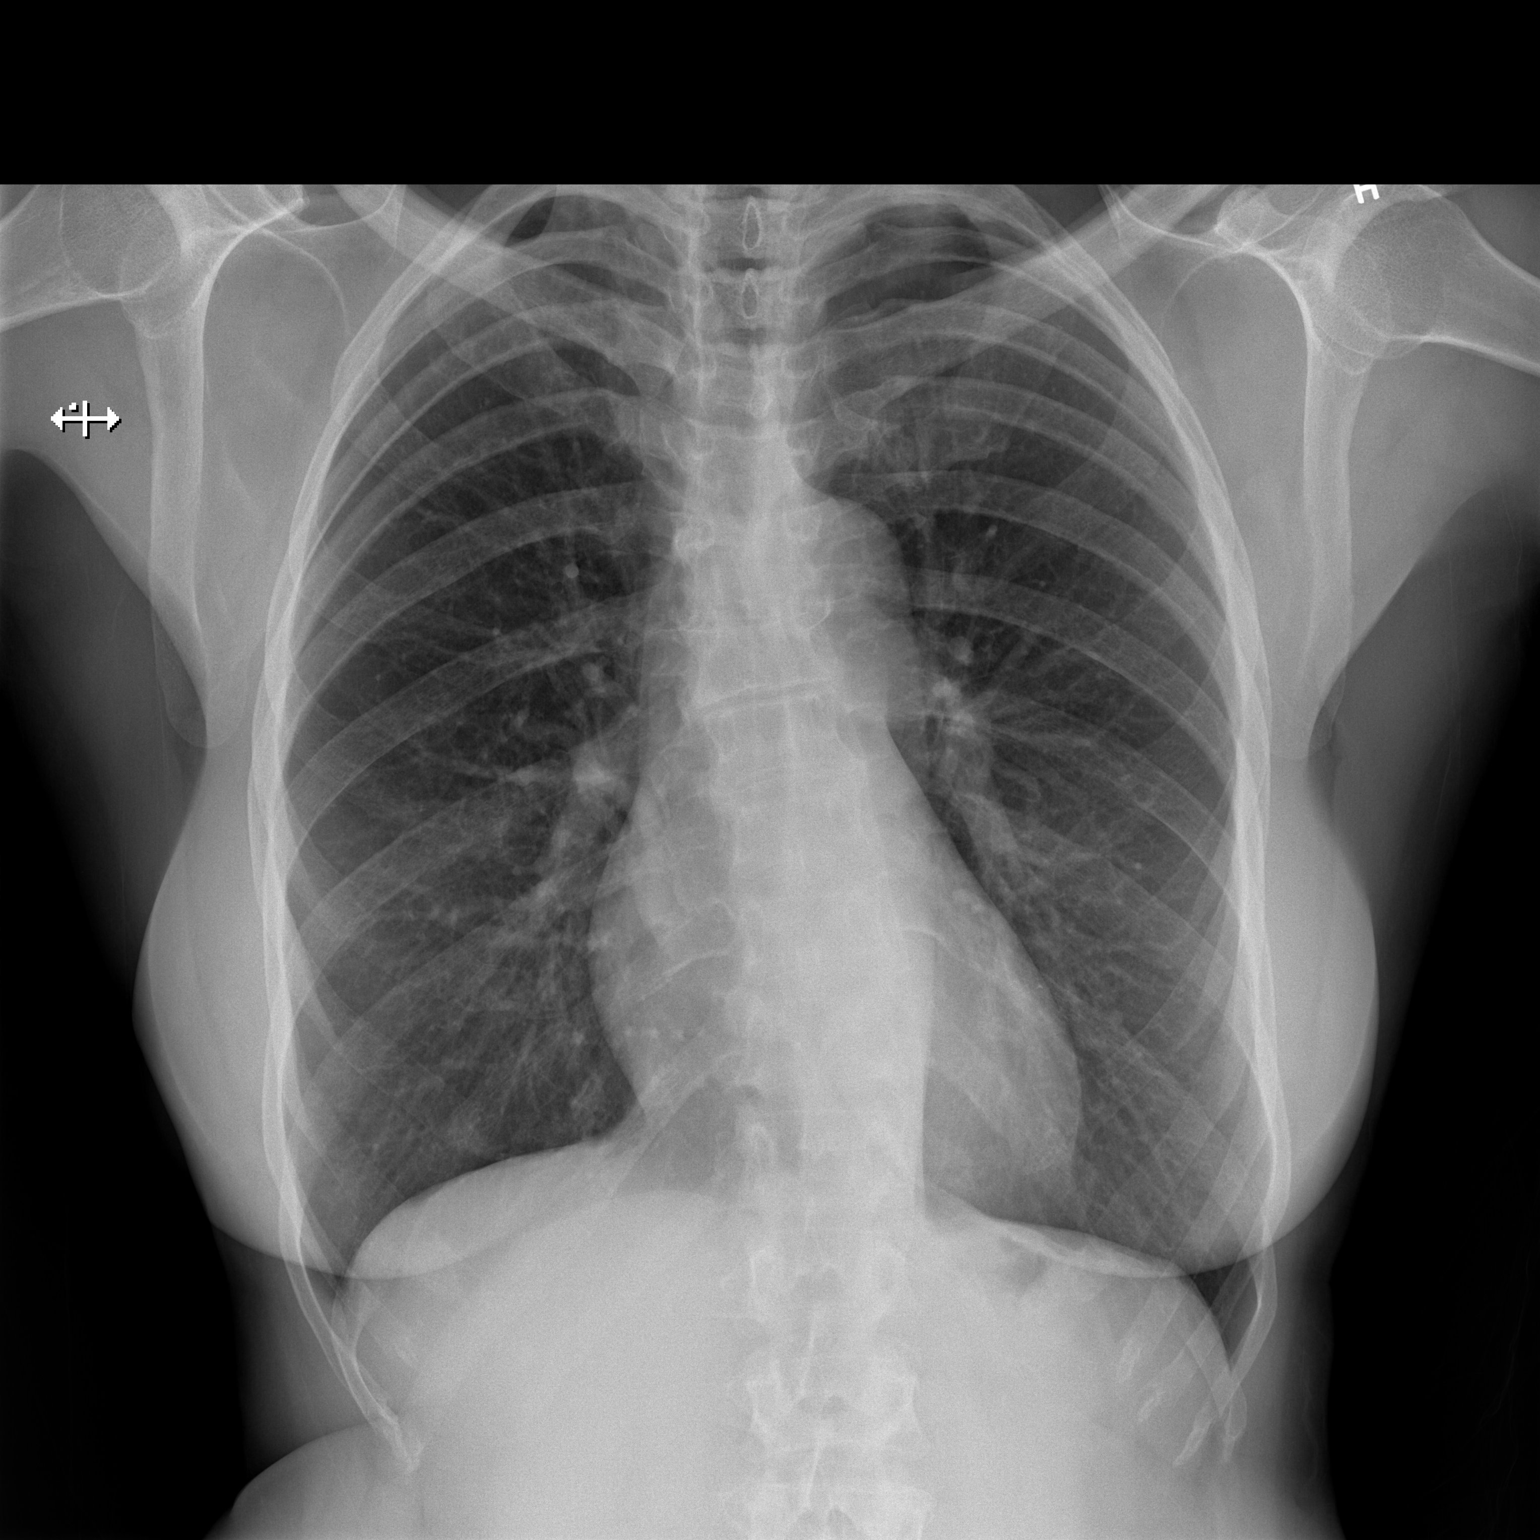

[w chest lat]
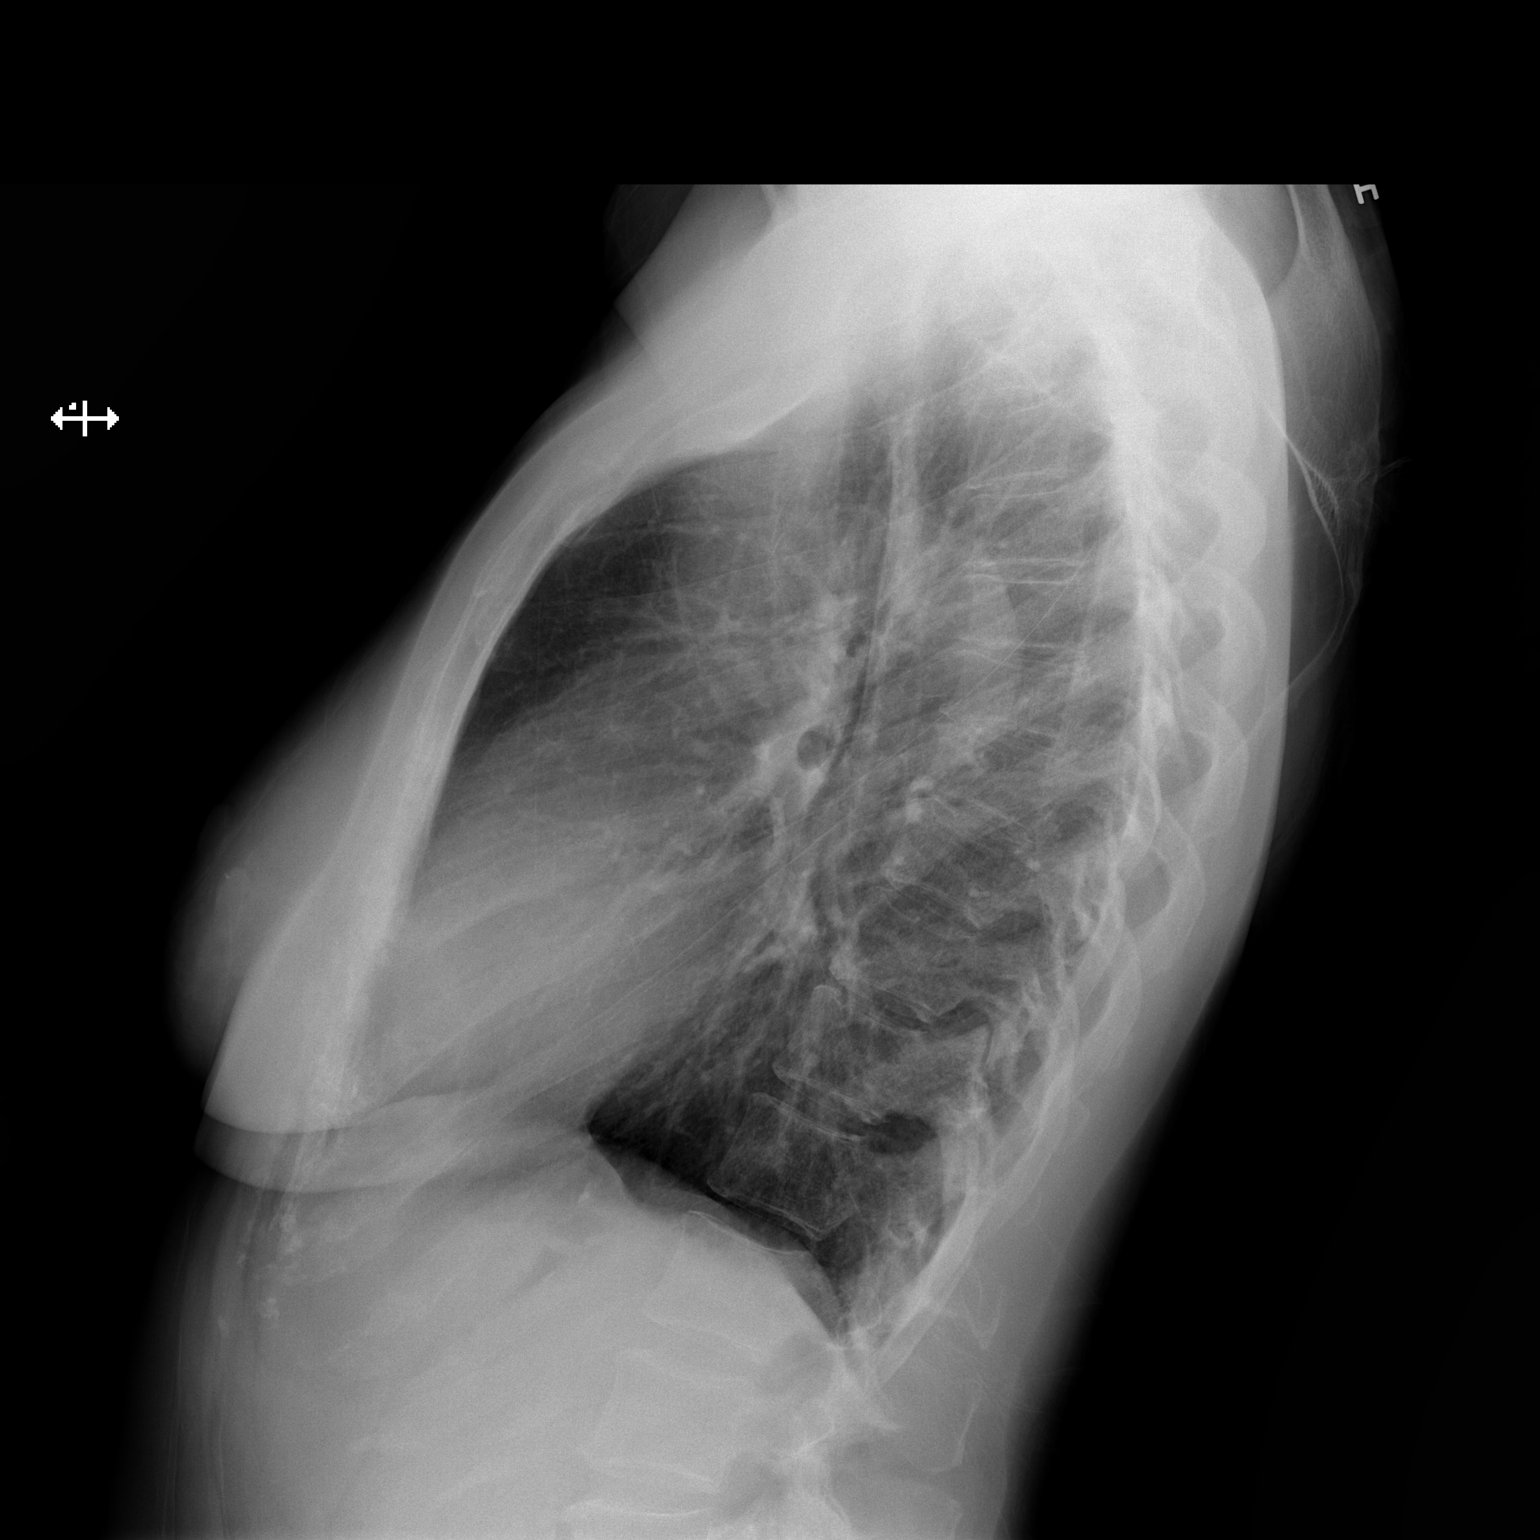

[2 of 2 positions shown; findings below may reference images not displayed]

FINDINGS: Lungs are clear. No pleural effusion or pneumothorax.

Apparent nodular opacity in the right lung base corresponds to
overlapping ribs.

The heart is normal in size.

Mild degenerative changes of the visualized thoracolumbar spine.
IMPRESSION: No evidence of acute cardiopulmonary disease.

## 2015-04-30 ENCOUNTER — Other Ambulatory Visit: Payer: Self-pay | Admitting: Family

## 2015-04-30 DIAGNOSIS — Z1231 Encounter for screening mammogram for malignant neoplasm of breast: Secondary | ICD-10-CM

## 2015-05-11 ENCOUNTER — Ambulatory Visit
Admission: RE | Admit: 2015-05-11 | Discharge: 2015-05-11 | Disposition: A | Discharge status: Home / Self Care | Payer: Medicare Other | Source: Ambulatory Visit | Attending: Family | Admitting: Family

## 2015-05-11 DIAGNOSIS — Z1231 Encounter for screening mammogram for malignant neoplasm of breast: Secondary | ICD-10-CM

## 2016-05-05 ENCOUNTER — Other Ambulatory Visit: Payer: Self-pay | Admitting: Family

## 2016-05-05 DIAGNOSIS — Z1231 Encounter for screening mammogram for malignant neoplasm of breast: Secondary | ICD-10-CM

## 2016-05-11 ENCOUNTER — Ambulatory Visit
Admission: RE | Admit: 2016-05-11 | Discharge: 2016-05-11 | Disposition: A | Discharge status: Home / Self Care | Payer: Medicare Other | Source: Ambulatory Visit | Attending: Family | Admitting: Family

## 2016-05-11 DIAGNOSIS — Z1231 Encounter for screening mammogram for malignant neoplasm of breast: Secondary | ICD-10-CM

## 2016-05-12 ENCOUNTER — Other Ambulatory Visit: Payer: Self-pay | Admitting: Family

## 2016-05-12 DIAGNOSIS — R928 Other abnormal and inconclusive findings on diagnostic imaging of breast: Secondary | ICD-10-CM

## 2016-05-17 ENCOUNTER — Inpatient Hospital Stay: Admission: RE | Admit: 2016-05-17 | Payer: Medicare Other | Source: Ambulatory Visit

## 2016-05-17 ENCOUNTER — Other Ambulatory Visit: Payer: Medicare Other

## 2016-05-18 ENCOUNTER — Ambulatory Visit
Admission: RE | Admit: 2016-05-18 | Discharge: 2016-05-18 | Disposition: A | Discharge status: Home / Self Care | Payer: Medicare Other | Source: Ambulatory Visit | Attending: Family | Admitting: Family

## 2016-05-18 DIAGNOSIS — R928 Other abnormal and inconclusive findings on diagnostic imaging of breast: Secondary | ICD-10-CM

## 2017-05-09 ENCOUNTER — Other Ambulatory Visit: Payer: Self-pay | Admitting: Family

## 2017-05-09 DIAGNOSIS — Z1231 Encounter for screening mammogram for malignant neoplasm of breast: Secondary | ICD-10-CM

## 2017-05-12 ENCOUNTER — Ambulatory Visit
Admission: RE | Admit: 2017-05-12 | Discharge: 2017-05-12 | Disposition: A | Discharge status: Home / Self Care | Payer: Medicare HMO | Source: Ambulatory Visit | Attending: Family | Admitting: Family

## 2017-05-12 DIAGNOSIS — Z1231 Encounter for screening mammogram for malignant neoplasm of breast: Secondary | ICD-10-CM

## 2018-04-26 ENCOUNTER — Other Ambulatory Visit: Payer: Self-pay | Admitting: Family

## 2018-04-26 DIAGNOSIS — Z1231 Encounter for screening mammogram for malignant neoplasm of breast: Secondary | ICD-10-CM

## 2018-05-17 ENCOUNTER — Ambulatory Visit
Admission: RE | Admit: 2018-05-17 | Discharge: 2018-05-17 | Disposition: A | Discharge status: Home / Self Care | Payer: Medicare HMO | Source: Ambulatory Visit | Attending: Family | Admitting: Family

## 2018-05-17 DIAGNOSIS — Z1231 Encounter for screening mammogram for malignant neoplasm of breast: Secondary | ICD-10-CM

## 2019-04-22 ENCOUNTER — Other Ambulatory Visit: Payer: Self-pay | Admitting: Family

## 2019-04-22 DIAGNOSIS — Z1231 Encounter for screening mammogram for malignant neoplasm of breast: Secondary | ICD-10-CM

## 2019-05-28 ENCOUNTER — Ambulatory Visit: Payer: Medicare HMO | Admitting: Cardiovascular Disease

## 2019-05-28 ENCOUNTER — Encounter (HOSPITAL_COMMUNITY): Payer: Medicare HMO

## 2019-05-31 ENCOUNTER — Other Ambulatory Visit (HOSPITAL_COMMUNITY): Payer: Self-pay | Admitting: Cardiovascular Disease

## 2019-06-04 ENCOUNTER — Ambulatory Visit: Payer: Medicare HMO

## 2019-06-04 ENCOUNTER — Encounter (HOSPITAL_COMMUNITY): Payer: Medicare HMO

## 2019-06-06 ENCOUNTER — Other Ambulatory Visit: Payer: Self-pay

## 2019-06-06 ENCOUNTER — Encounter: Payer: Self-pay | Admitting: Cardiovascular Disease

## 2019-06-06 ENCOUNTER — Ambulatory Visit (HOSPITAL_COMMUNITY)
Admission: RE | Admit: 2019-06-06 | Discharge: 2019-06-06 | Disposition: A | Discharge status: Home / Self Care | Payer: Medicare HMO | Source: Ambulatory Visit | Attending: Cardiology | Admitting: Cardiology

## 2019-06-06 ENCOUNTER — Other Ambulatory Visit (HOSPITAL_COMMUNITY): Payer: Self-pay | Admitting: Podiatry

## 2019-06-06 ENCOUNTER — Ambulatory Visit: Payer: Medicare HMO | Admitting: Cardiovascular Disease

## 2019-06-06 VITALS — BP 134/70 | HR 67 | Temp 97.3°F | Ht 68.0 in | Wt 172.0 lb

## 2019-06-06 DIAGNOSIS — I10 Essential (primary) hypertension: Secondary | ICD-10-CM

## 2019-06-06 DIAGNOSIS — I739 Peripheral vascular disease, unspecified: Secondary | ICD-10-CM

## 2019-06-06 DIAGNOSIS — I471 Supraventricular tachycardia, unspecified: Secondary | ICD-10-CM

## 2019-06-06 DIAGNOSIS — Z01810 Encounter for preprocedural cardiovascular examination: Secondary | ICD-10-CM

## 2019-06-06 DIAGNOSIS — R600 Localized edema: Secondary | ICD-10-CM

## 2019-06-06 NOTE — Progress Notes (Signed)
Cardiology Office Note:   Date:  06/06/2019  NAME:  [name] [name]    MRN: [id number] DOB:  1946-03-01   PCP:  Gennette Pac, NP  Cardiologist:  Evalina Field, MD  Electrophysiologist:  None   Referring MD: Rosemary Holms, DPM   Chief Complaint  Patient presents with  . Pre-op Exam   History of Present Illness:   [name] [name] is a 73 y.o. female with a hx of hypertension, atrial tachycardia, arthritis who is being seen today for the evaluation of preoperative cardiovascular assessment at the request of Nanine Means, MD. she reports she will have left foot surgery to remove a bunion.  Her podiatrist noted diminished pulses in the left foot, and this prompted referral to [place] today for further evaluation.  Regarding physical activity [name] [name] is extremely active, able to go up and down 7+ flights of stairs without limitations of chest pain, shortness of breath, palpitations, or any other symptoms.  She was diagnosed with atopic atrial beats in the past, but this is been managed with lifestyle modification, including stress reduction.  She reports may be 1-2 times per month she will get an extra heartbeat sensation in her chest that lasts seconds and resolves without any intervention.  There are no associated symptoms with these episodes.  Her only real medical problem is high blood pressure which is controlled with amlodipine today in office.  She reports her lower extremities can become edematous at the end of the day, or with exertion.  But she describes no symptoms of claudication or cramping in her lower extremities.  She is a never smoker and has no history of peripheral vascular disease.  She has no history of heart attack, stroke, diabetes, heart failure.  Her surgery is rather low risk.  She had a recent echocardiogram done in 2018 at [facility], and this was reviewed as below.  The echocardiogram was completely normal.  Past Medical History: Past Medical History:  Diagnosis  Date  . Arthritis   . Family history of anesthesia complication    daughter gets sick  . History of colon polyps 04/2013   benign  . Hypertension   . Supraventricular tachycardia ([place]) 2009   likely atrial tachycardia, well controlled with flecainide  . Syncope    vasovagal in etiology, without recurrence    Past Surgical History: Past Surgical History:  Procedure Laterality Date  . ABDOMINAL HYSTERECTOMY  2001  . BREAST CYST EXCISION Bilateral 20+yrs ago   no visible scar on rt breast  . BREAST LUMPECTOMY WITH NEEDLE LOCALIZATION Left 05/23/2013   Procedure: BREAST LUMPECTOMY WITH NEEDLE LOCALIZATION;  Surgeon: Merrie Roof, MD;  Location: [facility];  Service: General;  Laterality: Left;  . COLONOSCOPY  [id number]   Current Medications: No outpatient medications have been marked as taking for the 06/06/19 encounter (Office Visit) with Geralynn Rile, MD.     Allergies:    Patient has no known allergies.   Social History: Social History   Socioeconomic History  . Marital status: Single    Spouse name: Not on file  . Number of children: Not on file  . Years of education: Not on file  . Highest education level: Not on file  Occupational History  . Occupation: TEFL teacher: RETIRED    Comment: At a [employer]  . Financial resource strain: Not on file  . Food insecurity    Worry: Not on file  Inability: Not on file  . Transportation needs    Medical: Not on file    Non-medical: Not on file  Tobacco Use  . Smoking status: Never Smoker  . Smokeless tobacco: Never Used  Substance and Sexual Activity  . Alcohol use: No  . Drug use: No  . Sexual activity: Never  Lifestyle  . Physical activity    Days per week: Not on file    Minutes per session: Not on file  . Stress: Not on file  Relationships  . Social Herbalist on phone: Not on file    Gets together: Not on file    Attends religious service: Not on file     Active member of club or organization: Not on file    Attends meetings of clubs or organizations: Not on file    Relationship status: Not on file  Other Topics Concern  . Not on file  Social History Narrative   She lives in [place] with her daughter. She does exercise 3 days a week using elliptical and stationary bicycle or sometimes treadmill. She also reports that she does ab work.    Family History: The patient's family history includes COPD in her father; Heart attack in her father; Stroke in her mother.  ROS:   All other ROS reviewed and negative. Pertinent positives noted in the HPI.     EKGs/Labs/Other Studies Reviewed:   The following studies were personally reviewed by me today:  TTE ([facility] 2018): EF 60-65%, Grade 2 DD, LV Wall thickness ~1.0 cm  Heart Monitor [facility] 2018): brief runs of atrial ectopic beats (longest duration 16 beats)  EKG:  EKG is ordered today.  The ekg ordered today demonstrates normal sinus rhythm, frequent PACs noted, normal intervals, no ST-T changes to suggest ischemia, and was personally reviewed by me.   Recent Labs: No results found for requested labs within last 8760 hours.   Recent Lipid Panel No results found for: CHOL, TRIG, HDL, CHOLHDL, VLDL, LDLCALC, LDLDIRECT  Physical Exam:   VS:  BP 134/70   Pulse 67   Temp (!) 97.3 F (36.3 C) (Temporal)   Ht 5\' 8"  (1.727 m)   Wt 172 lb (78 kg)   SpO2 99%   BMI 26.15 kg/m    Wt Readings from Last 3 Encounters:  06/06/19 172 lb (78 kg)  06/05/13 177 lb 3.2 oz (80.4 kg)  05/22/13 177 lb 4 oz (80.4 kg)    General: Well nourished, well developed, in no acute distress Heart: Atraumatic, normal size  Eyes: PEERLA, EOMI  Neck: Supple, no JVD Endocrine: No thryomegaly Cardiac: Normal S1, S2; RRR; no murmurs, rubs, or gallops Lungs: Clear to auscultation bilaterally, no wheezing, rhonchi or rales  Abd: Soft, nontender, no hepatomegaly  Ext: No edema, diminished pulses Musculoskeletal: No  deformities, BUE and BLE strength normal and equal Skin: Warm and dry, no rashes   Neuro: Alert and oriented to person, place, time, and situation, CNII-XII grossly intact, no focal deficits  Psych: Normal mood and affect   ASSESSMENT:   NAME@ is a 73 y.o. female who presents for the following: 1. Preoperative cardiovascular examination   2. Essential hypertension   3. SVT (supraventricular tachycardia) ([place])   4. Leg edema     PLAN:   1. Preoperative Risk Assessment - The Revised Cardiac Risk Index = 0 which equates to 0=0.4% estimated risk of perioperative myocardial infarction, pulmonary edema, ventricular fibrillation, cardiac arrest, or complete heart block.  -  No further cardiac testing is recommended prior to surgery.  - The patient may proceed to surgery at acceptable risk.   -Due to diminished pulses on exam, we will obtain lower extremity vascular duplexes and ABIs in office today.  We will send these results to her podiatrist  2. Essential hypertension -Controlled on current medications  3. SVT (supraventricular tachycardia) ([facility]) -EKG with frequent PACs, however no symptoms and no real indication for treatment at this point -I offered her the opportunity to follow with me regarding this, but she reports she will monitor her symptoms herself and should they become more problematic or she experiences more frequently she will return to see [place]  Disposition: Return if symptoms worsen or fail to improve.  Medication Adjustments/Labs and Tests Ordered: Current medicines are reviewed at length with the patient today.  Concerns regarding medicines are outlined above.  No orders of the defined types were placed in this encounter.  No orders of the defined types were placed in this encounter.   Patient Instructions  Medication Instructions:  The current medical regimen is effective;  continue present plan and medications.  If you need a refill on your cardiac medications  before your next appointment, please call your pharmacy.   Testing/Procedures: Your physician has requested that you have an ankle brachial index (ABI). Today or tomorrow? During this test an ultrasound and blood pressure cuff are used to evaluate the arteries that supply the arms and legs with blood. Allow thirty minutes for this exam. There are no restrictions or special instructions.   Follow-Up: At [facility], you and your health needs are our priority.  As part of our continuing mission to provide you with exceptional heart care, we have created designated Provider Care Teams.  These Care Teams include your primary Cardiologist (physician) and Advanced Practice Providers (APPs -  Physician Assistants and Nurse Practitioners) who all work together to provide you with the care you need, when you need it. . Follow up with Dr.O'Neal as needed.         Signed, Addison Naegeli. Audie Box, [place]  [address] [place], [place] [address] [phone]  06/06/2019 8:36 AM

## 2019-06-06 NOTE — Addendum Note (Signed)
Addended by: Diana Eves on: 06/06/2019 02:21 PM   Modules accepted: Orders

## 2019-06-06 NOTE — Patient Instructions (Addendum)
Medication Instructions:  The current medical regimen is effective;  continue present plan and medications.  If you need a refill on your cardiac medications before your next appointment, please call your pharmacy.   Testing/Procedures: Your physician has requested that you have an ankle brachial index (ABI). Today or tomorrow? During this test an ultrasound and blood pressure cuff are used to evaluate the arteries that supply the arms and legs with blood. Allow thirty minutes for this exam. There are no restrictions or special instructions.   Follow-Up: At [facility], you and your health needs are our priority.  As part of our continuing mission to provide you with exceptional heart care, we have created designated Provider Care Teams.  These Care Teams include your primary Cardiologist (physician) and Advanced Practice Providers (APPs -  Physician Assistants and Nurse Practitioners) who all work together to provide you with the care you need, when you need it. . Follow up with Dr.O'Neal as needed.

## 2019-06-07 ENCOUNTER — Encounter: Payer: Self-pay | Admitting: Cardiovascular Disease

## 2019-06-07 ENCOUNTER — Encounter (HOSPITAL_COMMUNITY): Payer: Medicare HMO

## 2019-06-07 ENCOUNTER — Telehealth: Payer: Self-pay | Admitting: *Deleted

## 2019-06-07 NOTE — Telephone Encounter (Signed)
No answer   will try Monday 06/10/19    Reviewing  Patient chart  - patient has an appointment with Dr berry on 06/11/19  pv consult , schedule for bunion surgery 06/14/19   placed recall in for Dr Audie Box for 6 months  As requested.

## 2019-06-07 NOTE — Telephone Encounter (Signed)
-----   Message from Geralynn Rile, MD sent at 06/07/2019 10:29 AM EDT ----- Can you call Ms. 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  Have her follow-up with me in 6 months for this.  We also need to send a note to her podiatrist, updating her of the diagnosis of mild PAD.  But she is free to proceed with bunion surgery of the left foot despite this.  And no cardiac testing is indicated prior to surgery.  Evalina Field, MD

## 2019-06-10 ENCOUNTER — Telehealth: Payer: Self-pay | Admitting: Cardiovascular Disease

## 2019-06-10 NOTE — Telephone Encounter (Signed)
Follow up   Patient is returning call per after hours voice message per the previous message. Patient would like a return call.

## 2019-06-11 ENCOUNTER — Ambulatory Visit (INDEPENDENT_AMBULATORY_CARE_PROVIDER_SITE_OTHER): Payer: Medicare HMO | Admitting: Cardiovascular Disease

## 2019-06-11 ENCOUNTER — Other Ambulatory Visit: Payer: Self-pay

## 2019-06-11 ENCOUNTER — Other Ambulatory Visit (HOSPITAL_COMMUNITY)
Admission: RE | Admit: 2019-06-11 | Discharge: 2019-06-11 | Disposition: A | Discharge status: Home / Self Care | Payer: Medicare HMO | Source: Ambulatory Visit | Attending: Podiatry | Admitting: Podiatry

## 2019-06-11 ENCOUNTER — Encounter: Payer: Self-pay | Admitting: Cardiovascular Disease

## 2019-06-11 VITALS — HR 83 | Temp 97.6°F | Ht 67.0 in | Wt 172.2 lb

## 2019-06-11 DIAGNOSIS — Z20828 Contact with and (suspected) exposure to other viral communicable diseases: Secondary | ICD-10-CM | POA: Diagnosis not present

## 2019-06-11 DIAGNOSIS — I739 Peripheral vascular disease, unspecified: Secondary | ICD-10-CM

## 2019-06-11 DIAGNOSIS — Z01812 Encounter for preprocedural laboratory examination: Secondary | ICD-10-CM | POA: Insufficient documentation

## 2019-06-11 LAB — SARS CORONAVIRUS 2 (TAT 6-24 HRS): SARS Coronavirus 2: NEGATIVE

## 2019-06-11 NOTE — Patient Instructions (Signed)
Medication Instructions:  Your physician recommends that you continue on your current medications as directed. Please refer to the Current Medication list given to you today.  If you need a refill on your cardiac medications before your next appointment, please call your pharmacy.   Lab work: none If you have labs (blood work) drawn today and your tests are completely normal, you will receive your results only by: Marland Kitchen MyChart Message (if you have MyChart) OR . A paper copy in the mail If you have any lab test that is abnormal or we need to change your treatment, we will call you to review the results.  Testing/Procedures: none  Follow-Up: At [facility], you and your health needs are our priority.  As part of our continuing mission to provide you with exceptional heart care, we have created designated Provider Care Teams.  These Care Teams include your primary Cardiologist (physician) and Advanced Practice Providers (APPs -  Physician Assistants and Nurse Practitioners) who all work together to provide you with the care you need, when you need it. . You will need a follow up appointment as needed. You may see Dr. Gwenlyn Found or one of the following Advanced Practice Providers on your designated Care Team:   . Kerin Ransom, PA-C . Daleen Snook Kroeger, PA-C . Sande Rives, PA-C . Almyra Deforest, PA-C . Fabian Sharp, PA-C . Jory Sims, DNP . Rosaria Ferries, PA-C

## 2019-06-11 NOTE — Progress Notes (Signed)
[name] [name] presents today for peripheral vascular valuation at the request of Dr. Fritzi Mandes .  She is scheduled for left foot bunionectomy.  She had recent Doppler studies that showed normal circulation bilaterally without evidence of obstruction.  I do not think she will have a vascular issue with regards to healing.  I will communicate this to her podiatrist and will see her back PRN.  Lorretta Harp, M.D., [place], [facility], [place] [address] [place], [place]  [address]  [phone] 06/11/2019 11:32 AM

## 2019-06-12 ENCOUNTER — Telehealth: Payer: Self-pay | Admitting: Cardiovascular Disease

## 2019-06-12 ENCOUNTER — Ambulatory Visit: Payer: Self-pay | Admitting: Podiatry

## 2019-06-12 NOTE — Telephone Encounter (Signed)
I faxed Dr. Kathalene Frames and Dr. Kennon Holter last office notes.

## 2019-06-12 NOTE — Telephone Encounter (Signed)
° °  Podiatry office requesting last ov note be faxed          [facility] Pre-operative Risk Assessment    Request for surgical clearance:  1. What type of surgery is being performed? Foot surgery, bunion  2. When is this surgery scheduled? 06/14/19  3. What type of clearance is required (medical clearance vs. Pharmacy clearance to hold med vs. Both)?  4. Are there any medications that need to be held prior to surgery and how long?  5. Practice name and name of physician performing surgery? [facility]  6. What is your office phone number [phone]   7.   What is your office fax number [phone]  8.   Anesthesia type (None, local, MAC, general) ?    [name] 06/12/2019, 10:34 AM  _________________________________________________________________   (provider comments below)

## 2019-06-12 NOTE — Telephone Encounter (Signed)
Follow Up:      Pt is calling to see if her clearance have been sent to her surgeon?

## 2019-06-13 ENCOUNTER — Other Ambulatory Visit: Payer: Self-pay

## 2019-06-13 ENCOUNTER — Encounter (HOSPITAL_BASED_OUTPATIENT_CLINIC_OR_DEPARTMENT_OTHER): Payer: Self-pay | Admitting: *Deleted

## 2019-06-13 NOTE — Progress Notes (Addendum)
Spoke w/ pt via phone for pre-op interview.  Npo after mn. Arrive at [facility].  Needs istat.  Current ekg in chart and epic. Pt had covid test done 06-11-2019.  Will take norvasc am dos w/ sips of water.  Pt had been rescheduled from 06-03-2019,  H&P that is with chart is dated 05-08-2019.  Called and spoke w/ Jeneen Rinks , office manager for dr Fritzi Mandes, about getting updated H&P stated he will take care of it.  Pt has cardiac clearance/ office note per dr Audie Box dated 06-06-2019 in epic and chart.  Chart to be reviewed by anesthesia, Konrad Felix PA.   PCP -   Marrianne Mood FNP [facility]) Cardiologist -   dr Audie Box,  note 06-06-2019 epic  Chest x-ray -  05-22-2013 epic EKG -  06-06-2019 epic Stress Test -  06-16-2009  epic ECHO -  06-09-2009 epic Cardiac Cath -  no  Sleep Study -  no CPAP -  no  Fasting Blood Sugar -  n/a Checks Blood Sugar _____ times a day  Blood Thinner Instructions: n/a Aspirin Instructions: asa 81mg ,  per pt told to continue Last Dose:  last dose this morning   Anesthesia review:   hx PAT/ SVT, 08/ 2010 at that time followed by dr allred until 2013 notes in epic.  Per pt no symptoms for several years and had stopped toprol/ flecanide, stated pcp has been follow her.  Pt H&P that I have is dated 05-08-2019 , she had been rescheduled from 06-03-2019.  I have spoken w/ office and they are calling pt's pcp to get updated H&P.  Cardiologist had given her clearance.  Patient denies shortness of breath, fever, cough and chest pain at phone interview.   ADDENDUM:  Per anesthesia ok to proceed.

## 2019-06-14 ENCOUNTER — Ambulatory Visit (HOSPITAL_BASED_OUTPATIENT_CLINIC_OR_DEPARTMENT_OTHER): Payer: Medicare HMO | Admitting: Physician Assistant

## 2019-06-14 ENCOUNTER — Other Ambulatory Visit: Payer: Self-pay

## 2019-06-14 ENCOUNTER — Ambulatory Visit (HOSPITAL_BASED_OUTPATIENT_CLINIC_OR_DEPARTMENT_OTHER)
Admission: RE | Admit: 2019-06-14 | Discharge: 2019-06-14 | Disposition: A | Discharge status: Home / Self Care | Payer: Medicare HMO | Attending: Podiatry | Admitting: Podiatry

## 2019-06-14 ENCOUNTER — Encounter (HOSPITAL_BASED_OUTPATIENT_CLINIC_OR_DEPARTMENT_OTHER): Payer: Self-pay | Admitting: Anesthesiology

## 2019-06-14 ENCOUNTER — Encounter (HOSPITAL_BASED_OUTPATIENT_CLINIC_OR_DEPARTMENT_OTHER)
Admission: RE | Disposition: A | Discharge status: Home / Self Care | Payer: Self-pay | Source: Home / Self Care | Attending: Podiatry

## 2019-06-14 DIAGNOSIS — M1990 Unspecified osteoarthritis, unspecified site: Secondary | ICD-10-CM | POA: Insufficient documentation

## 2019-06-14 DIAGNOSIS — Z86010 Personal history of colonic polyps: Secondary | ICD-10-CM | POA: Insufficient documentation

## 2019-06-14 DIAGNOSIS — Z90710 Acquired absence of both cervix and uterus: Secondary | ICD-10-CM | POA: Insufficient documentation

## 2019-06-14 DIAGNOSIS — Q6689 Other  specified congenital deformities of feet: Secondary | ICD-10-CM | POA: Diagnosis not present

## 2019-06-14 DIAGNOSIS — M2012 Hallux valgus (acquired), left foot: Secondary | ICD-10-CM | POA: Insufficient documentation

## 2019-06-14 DIAGNOSIS — M2042 Other hammer toe(s) (acquired), left foot: Secondary | ICD-10-CM | POA: Diagnosis not present

## 2019-06-14 DIAGNOSIS — Z8249 Family history of ischemic heart disease and other diseases of the circulatory system: Secondary | ICD-10-CM | POA: Diagnosis not present

## 2019-06-14 DIAGNOSIS — Z79899 Other long term (current) drug therapy: Secondary | ICD-10-CM | POA: Insufficient documentation

## 2019-06-14 DIAGNOSIS — I472 Ventricular tachycardia: Secondary | ICD-10-CM | POA: Insufficient documentation

## 2019-06-14 DIAGNOSIS — I10 Essential (primary) hypertension: Secondary | ICD-10-CM | POA: Diagnosis not present

## 2019-06-14 DIAGNOSIS — S93145A Subluxation of metatarsophalangeal joint of left lesser toe(s), initial encounter: Secondary | ICD-10-CM | POA: Insufficient documentation

## 2019-06-14 DIAGNOSIS — X58XXXA Exposure to other specified factors, initial encounter: Secondary | ICD-10-CM | POA: Insufficient documentation

## 2019-06-14 DIAGNOSIS — I739 Peripheral vascular disease, unspecified: Secondary | ICD-10-CM | POA: Insufficient documentation

## 2019-06-14 DIAGNOSIS — Z825 Family history of asthma and other chronic lower respiratory diseases: Secondary | ICD-10-CM | POA: Insufficient documentation

## 2019-06-14 DIAGNOSIS — Z823 Family history of stroke: Secondary | ICD-10-CM | POA: Diagnosis not present

## 2019-06-14 DIAGNOSIS — M21612 Bunion of left foot: Secondary | ICD-10-CM | POA: Diagnosis not present

## 2019-06-14 HISTORY — PX: HAMMER TOE SURGERY: SHX385

## 2019-06-14 HISTORY — PX: AIKEN OSTEOTOMY: SHX6331

## 2019-06-14 HISTORY — DX: Other supraventricular tachycardia: I47.19

## 2019-06-14 HISTORY — PX: HALLUX VALGUS LAPIDUS: SHX6626

## 2019-06-14 HISTORY — DX: Other seasonal allergic rhinitis: J30.2

## 2019-06-14 HISTORY — DX: Pain in left foot: M79.672

## 2019-06-14 HISTORY — DX: Presence of spectacles and contact lenses: Z97.3

## 2019-06-14 HISTORY — DX: Supraventricular tachycardia: I47.1

## 2019-06-14 HISTORY — DX: Peripheral vascular disease, unspecified: I73.9

## 2019-06-14 HISTORY — DX: Presence of dental prosthetic device (complete) (partial): Z97.2

## 2019-06-14 HISTORY — DX: Personal history of other specified conditions: Z87.898

## 2019-06-14 LAB — POCT I-STAT, CHEM 8
BUN: 15 mg/dL (ref 8–23)
Calcium, Ion: 1.28 mmol/L (ref 1.15–1.40)
Chloride: 104 mmol/L (ref 98–111)
Creatinine, Ser: 1 mg/dL (ref 0.44–1.00)
Glucose, Bld: 92 mg/dL (ref 70–99)
HCT: 43 % (ref 36.0–46.0)
Hemoglobin: 14.6 g/dL (ref 12.0–15.0)
Potassium: 4.8 mmol/L (ref 3.5–5.1)
Sodium: 143 mmol/L (ref 135–145)
TCO2: 30 mmol/L (ref 22–32)

## 2019-06-14 SURGERY — BUNIONECTOMY, LAPIDUS
Anesthesia: General | Site: Toe | Laterality: Left

## 2019-06-14 MED ORDER — CEFAZOLIN SODIUM-DEXTROSE 2-4 GM/100ML-% IV SOLN
INTRAVENOUS | Status: AC
  Filled 2019-06-14: qty 100

## 2019-06-14 MED ORDER — 0.9 % SODIUM CHLORIDE (POUR BTL) OPTIME
TOPICAL | Status: DC | PRN
  Administered 2019-06-14: 500 mL
  Administered 2019-06-14: 1000 mL

## 2019-06-14 MED ORDER — FENTANYL CITRATE (PF) 100 MCG/2ML IJ SOLN
INTRAMUSCULAR | Status: AC
  Filled 2019-06-14: qty 2

## 2019-06-14 MED ORDER — MIDAZOLAM HCL 2 MG/2ML IJ SOLN
0.5000 mg | Freq: Once | INTRAMUSCULAR | Status: DC | PRN
  Filled 2019-06-14: qty 2

## 2019-06-14 MED ORDER — DEXAMETHASONE SODIUM PHOSPHATE 10 MG/ML IJ SOLN
INTRAMUSCULAR | Status: AC
  Filled 2019-06-14: qty 1

## 2019-06-14 MED ORDER — CHLORHEXIDINE GLUCONATE CLOTH 2 % EX PADS
6.0000 | MEDICATED_PAD | Freq: Once | CUTANEOUS | Status: DC
  Filled 2019-06-14: qty 6

## 2019-06-14 MED ORDER — PROMETHAZINE HCL 25 MG/ML IJ SOLN
6.2500 mg | INTRAMUSCULAR | Status: DC | PRN
  Filled 2019-06-14: qty 1

## 2019-06-14 MED ORDER — PROPOFOL 10 MG/ML IV BOLUS
INTRAVENOUS | Status: DC | PRN
  Administered 2019-06-14: 120 mg via INTRAVENOUS
  Administered 2019-06-14: 80 mg via INTRAVENOUS

## 2019-06-14 MED ORDER — ONDANSETRON HCL 4 MG/2ML IJ SOLN
INTRAMUSCULAR | Status: DC | PRN
  Administered 2019-06-14: 4 mg via INTRAVENOUS

## 2019-06-14 MED ORDER — DEXAMETHASONE SODIUM PHOSPHATE 10 MG/ML IJ SOLN
INTRAMUSCULAR | Status: DC | PRN
  Administered 2019-06-14: 5 mg via INTRAVENOUS

## 2019-06-14 MED ORDER — LIDOCAINE 2% (20 MG/ML) 5 ML SYRINGE
INTRAMUSCULAR | Status: DC | PRN
  Administered 2019-06-14: 30 mg via INTRAVENOUS

## 2019-06-14 MED ORDER — FENTANYL CITRATE (PF) 100 MCG/2ML IJ SOLN
INTRAMUSCULAR | Status: DC | PRN
  Administered 2019-06-14 (×2): 25 ug via INTRAVENOUS
  Administered 2019-06-14: 50 ug via INTRAVENOUS
  Administered 2019-06-14: 25 ug via INTRAVENOUS

## 2019-06-14 MED ORDER — BUPIVACAINE LIPOSOME 1.3 % IJ SUSP
INTRAMUSCULAR | Status: AC
  Filled 2019-06-14: qty 40

## 2019-06-14 MED ORDER — BUPIVACAINE HCL 0.5 % IJ SOLN
INTRAMUSCULAR | Status: DC | PRN
  Administered 2019-06-14: 10 mL
  Administered 2019-06-14: 20 mL

## 2019-06-14 MED ORDER — ACETAMINOPHEN 500 MG PO TABS
ORAL_TABLET | ORAL | Status: AC
  Filled 2019-06-14: qty 2

## 2019-06-14 MED ORDER — EPHEDRINE SULFATE 50 MG/ML IJ SOLN
INTRAMUSCULAR | Status: DC | PRN
  Administered 2019-06-14: 10 mg via INTRAVENOUS
  Administered 2019-06-14: 5 mg via INTRAVENOUS

## 2019-06-14 MED ORDER — LACTATED RINGERS IV SOLN
INTRAVENOUS | Status: DC
  Administered 2019-06-14 (×2): via INTRAVENOUS
  Filled 2019-06-14: qty 1000

## 2019-06-14 MED ORDER — ONDANSETRON HCL 4 MG/2ML IJ SOLN
INTRAMUSCULAR | Status: AC
  Filled 2019-06-14: qty 2

## 2019-06-14 MED ORDER — BUPIVACAINE LIPOSOME 1.3 % IJ SUSP
INTRAMUSCULAR | Status: DC | PRN
  Administered 2019-06-14: 20 mL

## 2019-06-14 MED ORDER — KETOROLAC TROMETHAMINE 30 MG/ML IJ SOLN
INTRAMUSCULAR | Status: DC | PRN
  Administered 2019-06-14: 30 mg via INTRAVENOUS

## 2019-06-14 MED ORDER — MEPERIDINE HCL 25 MG/ML IJ SOLN
6.2500 mg | INTRAMUSCULAR | Status: DC | PRN
  Filled 2019-06-14: qty 1

## 2019-06-14 MED ORDER — PROPOFOL 10 MG/ML IV BOLUS
INTRAVENOUS | Status: AC
  Filled 2019-06-14: qty 20

## 2019-06-14 MED ORDER — ACETAMINOPHEN 500 MG PO TABS
1000.0000 mg | ORAL_TABLET | Freq: Once | ORAL | Status: AC
  Administered 2019-06-14: 11:00:00 1000 mg via ORAL
  Filled 2019-06-14: qty 2

## 2019-06-14 MED ORDER — LIDOCAINE 2% (20 MG/ML) 5 ML SYRINGE
INTRAMUSCULAR | Status: AC
  Filled 2019-06-14: qty 5

## 2019-06-14 MED ORDER — HYDROMORPHONE HCL 1 MG/ML IJ SOLN
0.2500 mg | INTRAMUSCULAR | Status: DC | PRN
  Filled 2019-06-14: qty 0.5

## 2019-06-14 MED ORDER — CEFAZOLIN SODIUM-DEXTROSE 2-4 GM/100ML-% IV SOLN
2.0000 g | INTRAVENOUS | Status: AC
  Administered 2019-06-14: 11:00:00 2 g via INTRAVENOUS
  Filled 2019-06-14: qty 100

## 2019-06-14 MED ORDER — KETOROLAC TROMETHAMINE 30 MG/ML IJ SOLN
INTRAMUSCULAR | Status: AC
  Filled 2019-06-14: qty 1

## 2019-06-14 MED ORDER — LIDOCAINE HCL 2 % IJ SOLN
INTRAMUSCULAR | Status: DC | PRN
  Administered 2019-06-14: 10 mL

## 2019-06-14 SURGICAL SUPPLY — 114 items
BAG URINE DRAINAGE (UROLOGICAL SUPPLIES) ×2 IMPLANT
BANDAGE CO FLEX L/F 2IN X 5YD (GAUZE/BANDAGES/DRESSINGS) ×2 IMPLANT
BANDAGE COBAN STERILE 2 (GAUZE/BANDAGES/DRESSINGS) ×4 IMPLANT
BANDAGE ELASTIC 6 VELCRO ST LF (GAUZE/BANDAGES/DRESSINGS) ×2 IMPLANT
BENZOIN TINCTURE PRP APPL 2/3 (GAUZE/BANDAGES/DRESSINGS) ×6 IMPLANT
BIT DRILL 2 (BIT) ×2 IMPLANT
BLADE AVERAGE 25MMX9MM (BLADE)
BLADE AVERAGE 25X9 (BLADE) ×2 IMPLANT
BLADE FLAT COURSE (BLADE) IMPLANT
BLADE OSC/SAG .038X5.5 CUT EDG (BLADE) IMPLANT
BLADE OSC/SAGITTAL MD 5.5X18 (BLADE) ×4 IMPLANT
BLADE OSCILLATING /SAGITTAL (BLADE) IMPLANT
BLADE SAGITTAL FINE 9.5X25.5 (BLADE) ×2 IMPLANT
BLADE SAW SAGI 13.0X70X1.19 (BLADE) IMPLANT
BLADE SAW SAGI 13.0X70X1.19MM (BLADE)
BLADE SURG 15 STRL LF DISP TIS (BLADE) ×8 IMPLANT
BLADE SURG 15 STRL SS (BLADE) ×10
BNDG COHESIVE 2X5 TAN STRL LF (GAUZE/BANDAGES/DRESSINGS) ×2 IMPLANT
BNDG COHESIVE 4X5 TAN NS LF (GAUZE/BANDAGES/DRESSINGS) ×2 IMPLANT
BNDG CONFORM 2 STRL LF (GAUZE/BANDAGES/DRESSINGS) ×6 IMPLANT
BNDG ESMARK 4X9 LF (GAUZE/BANDAGES/DRESSINGS) ×4 IMPLANT
BNDG GAUZE ELAST 4 BULKY (GAUZE/BANDAGES/DRESSINGS) ×2 IMPLANT
BUR SIDE CUT 44.8 STRL (BURR) IMPLANT
BUR SURG 4X8 MED (BURR) IMPLANT
BURR SIDE CUT 44.8 STRL (BURR)
BURR SIDE CUT 44.8MML STRL (BURR)
BURR SURG 4MMX8MM MEDIUM (BURR)
BURR SURG 4X8 MED (BURR)
CATH FOLEY 2WAY SLVR  5CC 16FR (CATHETERS) ×2
CATH FOLEY 2WAY SLVR 5CC 16FR (CATHETERS) IMPLANT
CLIP EASY STAPLE NITINOL (Clip) ×2 IMPLANT
CLOSURE WOUND 1/2 X4 (GAUZE/BANDAGES/DRESSINGS) ×1
CLOSURE WOUND 1/4X4 (GAUZE/BANDAGES/DRESSINGS)
CONTROL 360 (Bone Implant) ×2 IMPLANT
COVER BACK TABLE 60X90IN (DRAPES) ×4 IMPLANT
COVER MAYO STAND STRL (DRAPES) ×4 IMPLANT
COVER WAND RF STERILE (DRAPES) ×6 IMPLANT
CUFF TOURN SGL QUICK 18 (TOURNIQUET CUFF) ×4 IMPLANT
CUFF TOURN SGL QUICK 18X4 (TOURNIQUET CUFF) ×4 IMPLANT
DECANTER SPIKE VIAL GLASS SM (MISCELLANEOUS) ×2 IMPLANT
DRAPE EXTREMITY T 121X128X90 (DISPOSABLE) ×4 IMPLANT
DRAPE IMP U-DRAPE 54X76 (DRAPES) ×2 IMPLANT
DRAPE OEC MINIVIEW 54X84 (DRAPES) ×4 IMPLANT
DRAPE SHEET LG 3/4 BI-LAMINATE (DRAPES) ×2 IMPLANT
DRAPE SURG 17X23 STRL (DRAPES) ×4 IMPLANT
DRSG EMULSION OIL 3X3 NADH (GAUZE/BANDAGES/DRESSINGS) IMPLANT
DURAPREP 26ML APPLICATOR (WOUND CARE) ×4 IMPLANT
ELECT REM PT RETURN 9FT ADLT (ELECTROSURGICAL) ×4
ELECTRODE REM PT RTRN 9FT ADLT (ELECTROSURGICAL) ×2 IMPLANT
GAUZE SPONGE 4X4 12PLY STRL (GAUZE/BANDAGES/DRESSINGS) ×4 IMPLANT
GAUZE SPONGE 4X4 12PLY STRL LF (GAUZE/BANDAGES/DRESSINGS) ×2 IMPLANT
GAUZE XEROFORM 1X8 LF (GAUZE/BANDAGES/DRESSINGS) ×6 IMPLANT
GLOVE BIO SURGEON STRL SZ7.5 (GLOVE) ×8 IMPLANT
GLOVE BIOGEL PI IND STRL 7.5 (GLOVE) ×2 IMPLANT
GLOVE BIOGEL PI INDICATOR 7.5 (GLOVE) ×2
GLOVE INDICATOR 7.5 STRL GRN (GLOVE) ×4 IMPLANT
GOWN STRL REUS W/ TWL LRG LVL3 (GOWN DISPOSABLE) ×2 IMPLANT
GOWN STRL REUS W/ TWL XL LVL3 (GOWN DISPOSABLE) ×2 IMPLANT
GOWN STRL REUS W/TWL LRG LVL3 (GOWN DISPOSABLE) ×2
GOWN STRL REUS W/TWL XL LVL3 (GOWN DISPOSABLE) ×2
K-WIRE CAPS STERILE WHITE .045 (WIRE) ×2 IMPLANT
KIT TURNOVER CYSTO (KITS) ×4 IMPLANT
KWIRE 4.0 X .045IN (WIRE) ×4 IMPLANT
LOOP VESSEL MAXI BLUE (MISCELLANEOUS) ×4 IMPLANT
MANIFOLD NEPTUNE II (INSTRUMENTS) IMPLANT
NDL SAFETY ECLIPSE 18X1.5 (NEEDLE) ×2 IMPLANT
NEEDLE 27GAX1X1/2 (NEEDLE) ×6 IMPLANT
NEEDLE HYPO 18GX1.5 SHARP (NEEDLE)
NEEDLE HYPO 22GX1.5 SAFETY (NEEDLE) ×8 IMPLANT
NS IRRIG 500ML POUR BTL (IV SOLUTION) ×6 IMPLANT
PACK BASIN DAY SURGERY FS (CUSTOM PROCEDURE TRAY) ×4 IMPLANT
PAD CAST 4YDX4 CTTN HI CHSV (CAST SUPPLIES) ×2 IMPLANT
PADDING CAST ABS 4INX4YD NS (CAST SUPPLIES)
PADDING CAST ABS COTTON 4X4 ST (CAST SUPPLIES) ×2 IMPLANT
PADDING CAST COTTON 4X4 STRL (CAST SUPPLIES)
PADDING CAST SYNTHETIC 2 (CAST SUPPLIES)
PADDING CAST SYNTHETIC 2X4 NS (CAST SUPPLIES) IMPLANT
PADDING CAST SYNTHETIC 3 NS LF (CAST SUPPLIES)
PADDING CAST SYNTHETIC 3X4 NS (CAST SUPPLIES) IMPLANT
PENCIL BUTTON HOLSTER BLD 10FT (ELECTRODE) ×4 IMPLANT
POST 2MM (Post) ×2 IMPLANT
RASP CROSS CUT 5.5X9.5MM (MISCELLANEOUS) ×2 IMPLANT
RASP SM TEAR CROSS CUT (RASP) IMPLANT
SCOTCHCAST PLUS 5X4 WHITE (CAST SUPPLIES) IMPLANT
SCREW LONG PK LAPIPLASTY 2.7 (Screw) ×2 IMPLANT
SCREW NON-CANN LG 2X15 (Screw) ×2 IMPLANT
SPONGE LAP 4X18 RFD (DISPOSABLE) ×4 IMPLANT
STOCKINETTE 4X48 STRL (DRAPES) IMPLANT
STOCKINETTE 6  STRL (DRAPES) ×4
STOCKINETTE 6 STRL (DRAPES) ×2 IMPLANT
STOCKINETTE SYNTHETIC 4 NONSTR (MISCELLANEOUS) ×4 IMPLANT
STRIP CLOSURE SKIN 1/2X4 (GAUZE/BANDAGES/DRESSINGS) ×1 IMPLANT
STRIP CLOSURE SKIN 1/4X4 (GAUZE/BANDAGES/DRESSINGS) ×6 IMPLANT
SUCTION FRAZIER HANDLE 10FR (MISCELLANEOUS) ×2
SUCTION TUBE FRAZIER 10FR DISP (MISCELLANEOUS) ×2 IMPLANT
SUT ETHILON 4 0 PS 2 18 (SUTURE) ×6 IMPLANT
SUT ETHILON 5 0 PS 2 18 (SUTURE) ×2 IMPLANT
SUT MNCRL AB 4-0 PS2 18 (SUTURE) ×2 IMPLANT
SUT VIC AB 3-0 FS2 27 (SUTURE) ×2 IMPLANT
SUT VIC AB 3-0 SH 27 (SUTURE) ×6
SUT VIC AB 3-0 SH 27X BRD (SUTURE) ×2 IMPLANT
SUT VIC AB 4-0 SH 27 (SUTURE) ×4
SUT VIC AB 4-0 SH 27XANBCTRL (SUTURE) ×2 IMPLANT
SUT VIC AB 5-0 PS2 18 (SUTURE) ×2 IMPLANT
SUT VICRYL 4-0 PS2 18IN ABS (SUTURE) ×6 IMPLANT
SYR 3ML 18GX1 1/2 (SYRINGE) ×2 IMPLANT
SYR BULB 3OZ (MISCELLANEOUS) ×4 IMPLANT
SYR CONTROL 10ML LL (SYRINGE) ×10 IMPLANT
SYRINGE 12CC LL (MISCELLANEOUS) ×2 IMPLANT
Stryker 2.0 m  Drill ×2 IMPLANT
TUBE CONNECTING 12'X1/4 (SUCTIONS) ×1
TUBE CONNECTING 12X1/4 (SUCTIONS) ×3 IMPLANT
UNDERPAD 30X30 (UNDERPADS AND DIAPERS) ×4 IMPLANT
WATER STERILE IRR 500ML POUR (IV SOLUTION) ×2 IMPLANT

## 2019-06-14 NOTE — Anesthesia Procedure Notes (Signed)
Procedure Name: LMA Insertion Date/Time: 06/14/2019 11:23 AM Performed by: Wanita Chamberlain, CRNA Pre-anesthesia Checklist: Patient identified, Emergency Drugs available, Suction available and Patient being monitored Patient Re-evaluated:Patient Re-evaluated prior to induction Oxygen Delivery Method: Circle system utilized Preoxygenation: Pre-oxygenation with 100% oxygen Induction Type: IV induction Ventilation: Mask ventilation without difficulty LMA: LMA inserted LMA Size: 4.0 Number of attempts: 1 Placement Confirmation: breath sounds checked- equal and bilateral,  positive ETCO2 and CO2 detector Tube secured with: Tape Dental Injury: Teeth and Oropharynx as per pre-operative assessment

## 2019-06-14 NOTE — Discharge Instructions (Signed)
°  Post Anesthesia Home Care Instructions  Activity: Get plenty of rest for the remainder of the day. A responsible individual must stay with you for 24 hours following the procedure.  For the next 24 hours, DO NOT: -Drive a car -Paediatric nurse -Drink alcoholic beverages -Take any medication unless instructed by your physician -Make any legal decisions or sign important papers.  Meals: Start with liquid foods such as gelatin or soup. Progress to regular foods as tolerated. Avoid greasy, spicy, heavy foods. If nausea and/or vomiting occur, drink only clear liquids until the nausea and/or vomiting subsides. Call your physician if vomiting continues.  Special Instructions/Symptoms: Your throat may feel dry or sore from the anesthesia or the breathing tube placed in your throat during surgery. If this causes discomfort, gargle with warm salt water. The discomfort should disappear within 24 hours.  If you had a scopolamine patch placed behind your ear for the management of post- operative nausea and/or vomiting:  1. The medication in the patch is effective for 72 hours, after which it should be removed.  Wrap patch in a tissue and discard in the trash. Wash hands thoroughly with soap and water. 2. You may remove the patch earlier than 72 hours if you experience unpleasant side effects which may include dry mouth, dizziness or visual disturbances. 3. Avoid touching the patch. Wash your hands with soap and water after contact with the patch.    Information for Discharge Teaching: EXPAREL (bupivacaine liposome injectable suspension)   Your surgeon or anesthesiologist gave you EXPAREL(bupivacaine) to help control your pain after surgery.   EXPAREL is a local anesthetic that provides pain relief by numbing the tissue around the surgical site.  EXPAREL is designed to release pain medication over time and can control pain for up to 72 hours.  Depending on how you respond to EXPAREL, you may  require less pain medication during your recovery.  Possible side effects:  Temporary loss of sensation or ability to move in the area where bupivacaine was injected.  Nausea, vomiting, constipation  Rarely, numbness and tingling in your mouth or lips, lightheadedness, or anxiety may occur.  Call your doctor right away if you think you may be experiencing any of these sensations, or if you have other questions regarding possible side effects.  Follow all other discharge instructions given to you by your surgeon or nurse. Eat a healthy diet and drink plenty of water or other fluids.  If you return to the hospital for any reason within 96 hours following the administration of EXPAREL, it is important for health care providers to know that you have received this anesthetic. A teal colored band has been placed on your arm with the date, time and amount of EXPAREL you have received in order to alert and inform your health care providers. Please leave this armband in place for the full 96 hours following administration, and then you may remove the band.Call your surgeon if you experience:   1.  Fever over 101.0. 2.  Nausea and/or vomiting. 3.  Extreme swelling or bruising at the surgical site. 4.  Continued bleeding from the incision. 5.  Increased pain, redness or drainage from the incision. 6.  Problems related to your pain medication. 7.  Any problems and/or concerns

## 2019-06-14 NOTE — Op Note (Signed)
[name] [name], [name] [name] MEDICAL RECORD [id number] ACCOUNT [id number] DATE OF BIRTH:1945/10/24 FACILITY: [facility] LOCATION: [facility] PHYSICIAN:Desirie Minteer, DPM  OPERATIVE REPORT  DATE OF PROCEDURE:  06/14/2019  SURGEON:  Twanna Hy. Fritzi Mandes, DPM  ASSISTANT:  None.  PREOPERATIVE DIAGNOSES: 1.  Hallux abductovalgus deformity with hypermobility, left foot. 2.  Hallux deformity, left foot. 3.  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 4.  Second hammertoe.  POSTOPERATIVE DIAGNOSES: 1.  Hallux abductovalgus deformity with hypermobility, left foot. 2.  Hallux deformity, left foot. 3.  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 4.  Second hammertoe.  ANESTHESIA:  LMA.  PROCEDURE PERFORMED: 1.  First metatarsal first cuneiform fusion and internal fixation, left foot. 2.  Bunion correction with proximal phalanx osteotomy and internal fixation, left foot  3.  Second metatarsal osteotomy with internal fixation. 4.  Second hammertoe repair, left foot.  COMPLICATIONS:  None.  DESCRIPTION OF PROCEDURE:  The patient was brought to the OR and placed in the supine position, at which time general anesthesia was administered.  A well-padded pneumatic ankle tourniquet was applied superior to the medial malleolus.  Once the patient  was prepped and draped in the usual aseptic manner, the previously applied tourniquet was inflated to 250 mmHg.  Attention was directed to the first tarsometatarsal joint line where a dorsal linear incision was made medial to the extensor hallucis longus  tendon.  The incision was deepened via sharp and blunt modalities, taking care to clamp and cauterize all bleeding vessels and ensure retraction of neurovascular structures encountered.  The deep and superficial fascia was separated to the level of the  capsule of the first tarsometatarsal joint.  Dissection was carried deep to the capsule  and periosteum using sharp subperiostial dissection.  Care was taken to protect all cutaneous nerves and neurovascular structures encountered.  Next, using a Publishing copy, a pocket for the fulcrum lateral to the base of the first metatarsal was performed between the base of the first metatarsal and second metatarsals.  Dissection was also carried to allow for exposure of the medial ridge of the first metatarsal.   After this was performed while retracting the extensor hallucis longus laterally, an unpowered sagittal saw blade was put halfway down through the first tarsometatarsal joint.  Once the power was on running, the blade was used up and down the joint to  flatten the surfaces from congruent frontal plane rotation.  A straight osteotome was then used in the first tarsometatarsal joint to release any remaining capsular and plantar ligaments.  At this point, the incision was directed to the distal aspect of  the first metatarsal into the first interspace.  The original incision was extended at this time.  Dissection was carried via sharp and blunt modalities to the level of the first interspace.  A lateral capsulotomy was performed and release of the  sesamoid accessory ligaments was performed.  Great care was taken to allow for medial transposition of the sesamoids and ensuring there was a complete release of the lateral capsular tissue.  Next, attention was redirected to the first tarsometatarsal  joint where a 2 mm pin was inserted medially.  This was used as a joystick to allow for rotation of the first metatarsal for appropriate reduction.  The pin was oriented parallel with the tarsometatarsal joint.  This was confirmed by intraoperative  radiographs and found to be satisfactory.  A trial manual reduction was performed under live fluoroscopy by rotating the joystick pen laterally  and manually applying pressure to the metatarsal head to confirm appropriate reduction.  Once this was found  to be  satisfactory, the attention was directed back to the first tarsometatarsal joint where the cut guide was used as a reference point for planning to place the positioner in the correct location.  This was performed over the second metatarsal and was  2-3 mm distal to the tip of the cut guide.  A small incision was made at this level, and then blunt dissection was carried to the level of the lateral aspect of the second metatarsal.  Next, the fulcrum was applied into the space between the first and  second metatarsals as far proximal as possible and parallel with the first tarsometatarsal joint in the sagittal plane.  Next, the positioner tip was placed over the second metatarsal, and then the other component of the positioner had a cup to engage  the medial ridge of the first metatarsal.  This was applied to 2-finger tightness.  Clinically, it was confirmed that application of the positioner completely reduced the deformity, pivoting the first metatarsal about the fulcrum, and this was confirmed  with intraoperative radiographs.  Next, a joint seeker was inserted in the first tarsometatarsal joint.  To secure the correction in place, a 1.6 mm K wire through the cannulation of the positioner was used, and this was fully into the second metatarsal  to maintain the correction.  This was confirmed with intraoperative radiographs.  The joint seeker was applied into the far lateral aspect of the first tarsometatarsal joint, allowing for the cut guide to sit directly on the dorsal cortex.  Once this was  performed, the silver cut guide was used.  The cut guide was touching bone and then fixated using 2 mm half pins.  It was confirmed that the cut guide and joint seeker were in proper position as well as the pins at both the most proximal and most distal  aspect of the cut guide.  Then, a third converging 2 mm half pin was inserted into the medial hole on the cuneiform side of the cut guide.  Once alignment was found  to be satisfactory, precision cuts were performed with the cut guide using a sagittal  saw.  This was performed, taking great care not to cut into the second metatarsal.  After the cuts were made, the cut guide was removed, leaving the 2 parallel pins in for the compressor and removing the converging pin.  A straight osteotome was used to  free up the metatarsal and cuneiform bone slices to make sure that the cuts were fully completed to the plantar and lateral cortices.  Next, the compressor arm was applied over the 2 parallel pins to distract open and access the first tarsometatarsal  joint through the appropriate holes.  The compressor was then used for moderate distraction to allow for visualization.  The bone slices were then able to be removed as well as all remaining plantar fragments of bone.  The joint surface was evaluated to  confirm that all articular cartilage had been removed and all bone fragments were also removed.  The area was then irrigated with copious amounts of sterile saline and antibiotic solution.  Next, the joint surfaces were prepared using a 2 mm straight  fluted drill with a drill sleeve to fenestrate the subchondral surfaces of the metatarsal and the cuneiform.  Next, joint opposition with the compressor was performed.  The compressor was then tightened to compress the first  tarsometatarsal surfaces  together to 2-finger tightness.  Joint apposition was confirmed with intraoperative radiographs, and once was satisfactory, provisional fixation with threaded olive wires were performed.  Two crisscross threaded olive wires were used from the lateral  aspect of the first metatarsal across the first tarsometatarsal joint into the center of the cuneiform was performed as well as a second so that there were 2 crossed wires.  Once this was found to be satisfactory, the medial biplanar plate was first  applied.  This was positioned, centered over the fusion site in direct medial to  ensure screws will be oriented 90 degrees to the dorsal lateral biplanar plate screws.  This was performed in the usual AO fashion using biplanar plate screws which were  predrilled first.  The compressor was then removed.  Next, one of the provisional fixation wires was removed.  Next, attention was given to the dorsal aspect of the joint level where a second plate was applied.  The biplanar plate was conforming to the  bony anatomy well, and once appropriate fixation was determined, this was fixated, taking great care to make sure that this stayed in the medial cuneiform and not in the intercuneiform joint.  This was confirmed with intraoperative radiographs.  In a  similar fashion to the medial plate, the dorsal plate was applied using new locking screws in all 4 holes.  All screws were tightened and found to be flush with the plate, and excellent fixation was achieved at this point.  All temporary fixation wires  were removed.  Intraoperative radiographs confirmed placement of the plates and ensured that there was good contact at the first tarsometatarsal joint and no elevation or plantar flexion of the first metatarsal at this level.  The area was irrigated with  copious amounts of sterile saline and antibiotic solution.  Deep closure was accomplished with 3-0 and 4-0 Vicryl and skin closure with 4-0 Monocryl.  Also, the second incision was closed with 4-0 nylon that was lateral to this first ray incision.  At  this point, a second procedure was found to be appropriate.  The incision was extended over the proximal phalanx.  At this point, the tourniquet was deflated at 1 hour and 59 minutes.  Twenty-two minutes were allowed before reperfusing the ankle  tourniquet.  During that time, the incision was reapproximated with suture.  Then the foot was exsanguinated again and the tourniquet reinflated.  At this point, a decision was made that there was still quite a bit of deformity and rotation of the   hallux, and another incision was made over the proximal phalanx.  The incision was deepened via sharp and blunt modalities, taking care to clamp and cauterize all bleeding vessels and ensuring retraction of neurovascular structures encountered.  The deep  and superficial fascia was separated medially and laterally the length of the incision.  The incision was deepened via sharp and blunt modalities, taking care to clamp and cauterize all bleeding vessels and ensuring retraction of neurovascular  structures encountered.  Deep and superficial fascia were separated medially and laterally the length of the incision.  At this point, an Akin osteotomy was performed in the proximal phalanx, taking care not to disrupt the lateral cortex.  A wedge of  bone was excised and then the osteotomy reduced and then fixated using a 10 mm nitinol staple by Stryker in the usual AO fashion.  This had previously been predrilled and then the staple applied to allow for compression and good  fixation.  This was  flushed with the bone.  This was found to be satisfactory, and then once satisfactory, the incision was irrigated with copious amounts of sterile saline and antibiotic solution.  Deep closure was accomplished with 3-0 and 4-0 Vicryl, skin closure with  4-0 Monocryl.  Next, at this point attention was directed to the second digit.  Two semi-elliptical incisions were made over the proximal interphalangeal joint.  The wedge of skin was removed and dissection carried to the level of the proximal interphalangeal joint.   Once exposed, the extensor tendon was released at this level and soft tissue freed from the head of the proximal phalanx and articular cartilage off the base of the middle phalanx.  The toe was then fixated using a 0.045 K wire through the level of the  proximal phalanx to the end of the toe and a pin cap was used.  The area was irrigated with copious amounts of sterile saline and antibiotic solution.  Deep  closure accomplished with 4-0 Vicryl and skin closure with 5-0 nylon.  Next, attention was directed to the second metatarsal where a curvilinear incision was made.  The incision was deepened via sharp and blunt modalities, taking care to clamp and cauterize all bleeding vessels and ensuring retraction of neurovascular  structures encountered.  Deep and superficial fascia was separated medially and laterally.  Great care was taken to identify the extensor tendon and protect it laterally.  This patient had severe deformity of the second metatarsophalangeal joint.  The  toe was completely subluxed and dorsal on the second metatarsal.  The base of the proximal phalanx was evaluated and found to be fragmented and in very poor condition.  Soft tissue releases were necessary to be able to expose the second metatarsal.  Once  enough soft tissue was freed, distraction techniques were used to expose the second metatarsal and elevate it.  At this point, the head was exposed and a Weil osteotomy was performed.  This was performed to shorten and slightly elevate the second  metatarsal.  The overhang of bone was resected with a rongeur, and then the osteotomy fixated with a 15 mm 2.0 snap-off screw, which was drilled perpendicular to the metatarsal.  This was found to be excellent fixation.  The area was irrigated with  copious amounts of sterile saline and antibiotic solution.  Deep closure accomplished with 3-0 and 4-0 Vicryl.  Skin closure accomplished with 4-0 nylon.  Postoperatively, Exparel and 0.05% Marcaine was infiltrated around the surgical sites for  long-lasting anesthesia.  The foot was dressed with Steri-Strips, Xeroform, 4 x 4's, Kling and Coban.  The tourniquet was deflated again, and reperfusion of the second toe occurred immediately as did to all the digits.  Reperfusion was at presurgical  levels.  A stockinette was applied.  The patient was then sent to the recovery room with vital signs stable and  capillary refill time at presurgical levels.  No guarantees given.  She is to remain completely nonweightbearing.  Crutches were ordered.  The  patient will follow up in the main office on 09/02.  The patient has my personal contact to get in touch with me if there are any issues.  LN/NUANCE  D:06/14/2019 T:06/14/2019 JOB:[id number]

## 2019-06-14 NOTE — Transfer of Care (Signed)
Immediate Anesthesia Transfer of Care Note  Patient: [name] [name]  Procedure(s) Performed: Procedure(s) (LRB): HALLUX VALGUS LAPIDUS (Left) Barbie Banner OSTEOTOMY (Left) HAMMER TOE CORRECTION (Left)  Patient Location: PACU  Anesthesia Type: General  Level of Consciousness: awake, oriented, sedated and patient cooperative  Airway & Oxygen Therapy: Patient Spontanous Breathing and Patient connected to face mask oxygen  Post-op Assessment: Report given to PACU RN and Post -op Vital signs reviewed and stable  Post vital signs: Reviewed and stable  Complications: No apparent anesthesia complications  Last Vitals:  Vitals Value Taken Time  BP 137/69 06/14/19 1634  Temp    Pulse 78 06/14/19 1635  Resp 13 06/14/19 1635  SpO2 100 % 06/14/19 1635  Vitals shown include unvalidated device data.  Last Pain:  Vitals:   06/14/19 0953  TempSrc:   PainSc: 0-No pain      Patients Stated Pain Goal: 6 (06/14/19 0953)

## 2019-06-14 NOTE — Anesthesia Preprocedure Evaluation (Addendum)
Anesthesia Evaluation  Patient identified by MRN, date of birth, ID band Patient awake    Reviewed: Allergy & Precautions, NPO status , Patient's Chart, lab work & pertinent test results  History of Anesthesia Complications Negative for: history of anesthetic complications  Airway Mallampati: II  TM Distance: >3 FB Neck ROM: Full    Dental  (+) Upper Dentures, Lower Dentures   Pulmonary neg pulmonary ROS,  06/11/2019 SARS coronavirus neg   breath sounds clear to auscultation       Cardiovascular hypertension, Pt. on medications + Peripheral Vascular Disease  + dysrhythmias Supra Ventricular Tachycardia  Rhythm:Regular Rate:Normal  '18 ECHO: EF 60-65%, mild TR   Neuro/Psych negative neurological ROS     GI/Hepatic negative GI ROS, Neg liver ROS,   Endo/Other  negative endocrine ROS  Renal/GU negative Renal ROS     Musculoskeletal  (+) Arthritis , Osteoarthritis,    Abdominal   Peds  Hematology negative hematology ROS (+)   Anesthesia Other Findings   Reproductive/Obstetrics                           Anesthesia Physical Anesthesia Plan  ASA: III  Anesthesia Plan: General   Post-op Pain Management:    Induction: Intravenous  PONV Risk Score and Plan: 3 and Dexamethasone, Ondansetron and Treatment may vary due to age or medical condition  Airway Management Planned: LMA  Additional Equipment:   Intra-op Plan:   Post-operative Plan:   Informed Consent: I have reviewed the patients History and Physical, chart, labs and discussed the procedure including the risks, benefits and alternatives for the proposed anesthesia with the patient or authorized representative who has indicated his/her understanding and acceptance.     Dental advisory given  Plan Discussed with: CRNA and Surgeon  Anesthesia Plan Comments: (Ajlouny to block pt's foot prior to her emergence)        Anesthesia Quick Evaluation

## 2019-06-14 NOTE — Progress Notes (Signed)
Patient presents for left foot surgery and she wants to proceed with care.  No guarantees given.  Procedure and details were reviewed with her again.  H&P was reviewed again.  She wants to proceed with care.

## 2019-06-14 NOTE — H&P (Signed)
Anesthesia H&P Update: History and Physical Exam reviewed; patient is OK for planned anesthetic and procedure. ? ?

## 2019-06-14 NOTE — Anesthesia Postprocedure Evaluation (Signed)
Anesthesia Post Note  Patient: CAYLOR HECKAMAN  Procedure(s) Performed: HALLUX VALGUS LAPIDUS (Left Foot) Barbie Banner OSTEOTOMY (Left Foot) HAMMER TOE CORRECTION (Left Toe)     Patient location during evaluation: PACU Anesthesia Type: General Level of consciousness: awake and alert Pain management: pain level controlled Vital Signs Assessment: post-procedure vital signs reviewed and stable Respiratory status: spontaneous breathing, nonlabored ventilation, respiratory function stable and patient connected to nasal cannula oxygen Cardiovascular status: blood pressure returned to baseline and stable Postop Assessment: no apparent nausea or vomiting Anesthetic complications: no    Last Vitals:  Vitals:   06/14/19 1730 06/14/19 1738  BP: 135/71   Pulse: 68   Resp: 14   Temp:  36.6 C  SpO2: 93% 97%    Last Pain:  Vitals:   06/14/19 1738  TempSrc:   PainSc: 0-No pain                 Effie Berkshire

## 2019-06-19 ENCOUNTER — Encounter (HOSPITAL_BASED_OUTPATIENT_CLINIC_OR_DEPARTMENT_OTHER): Payer: Self-pay | Admitting: Podiatry

## 2019-07-04 NOTE — Telephone Encounter (Signed)
SEE NOTE 06/07/19

## 2019-08-05 ENCOUNTER — Other Ambulatory Visit: Payer: Self-pay

## 2019-08-05 ENCOUNTER — Ambulatory Visit
Admission: RE | Admit: 2019-08-05 | Discharge: 2019-08-05 | Disposition: A | Discharge status: Home / Self Care | Payer: Medicare HMO | Source: Ambulatory Visit | Attending: Family | Admitting: Family

## 2019-08-05 DIAGNOSIS — Z1231 Encounter for screening mammogram for malignant neoplasm of breast: Secondary | ICD-10-CM

## 2020-06-26 ENCOUNTER — Other Ambulatory Visit: Payer: Self-pay | Admitting: Family

## 2020-06-26 DIAGNOSIS — Z1231 Encounter for screening mammogram for malignant neoplasm of breast: Secondary | ICD-10-CM

## 2020-08-05 ENCOUNTER — Other Ambulatory Visit: Payer: Self-pay

## 2020-08-05 ENCOUNTER — Ambulatory Visit
Admission: RE | Admit: 2020-08-05 | Discharge: 2020-08-05 | Disposition: A | Discharge status: Home / Self Care | Payer: Medicare HMO | Source: Ambulatory Visit | Attending: Family | Admitting: Family

## 2020-08-05 DIAGNOSIS — Z1231 Encounter for screening mammogram for malignant neoplasm of breast: Secondary | ICD-10-CM

## 2021-06-25 ENCOUNTER — Other Ambulatory Visit: Payer: Self-pay | Admitting: Family

## 2021-06-25 DIAGNOSIS — Z1231 Encounter for screening mammogram for malignant neoplasm of breast: Secondary | ICD-10-CM

## 2021-08-06 ENCOUNTER — Ambulatory Visit
Admission: RE | Admit: 2021-08-06 | Discharge: 2021-08-06 | Disposition: A | Discharge status: Home / Self Care | Payer: Medicare HMO | Source: Ambulatory Visit | Attending: Family | Admitting: Family

## 2021-08-06 ENCOUNTER — Other Ambulatory Visit: Payer: Self-pay

## 2021-08-06 DIAGNOSIS — Z1231 Encounter for screening mammogram for malignant neoplasm of breast: Secondary | ICD-10-CM

## 2022-02-06 IMAGING — MG DIGITAL SCREENING BILAT W/ TOMO W/ CAD
6 of 10 series · 6 of 30 positions shown · non-contrast
Comparison: Previous exam(s).

CLINICAL DATA: Screening.

EXAM:
DIGITAL SCREENING BILATERAL MAMMOGRAM WITH TOMO AND CAD

[L MLO synth-2D (1 of 2)]
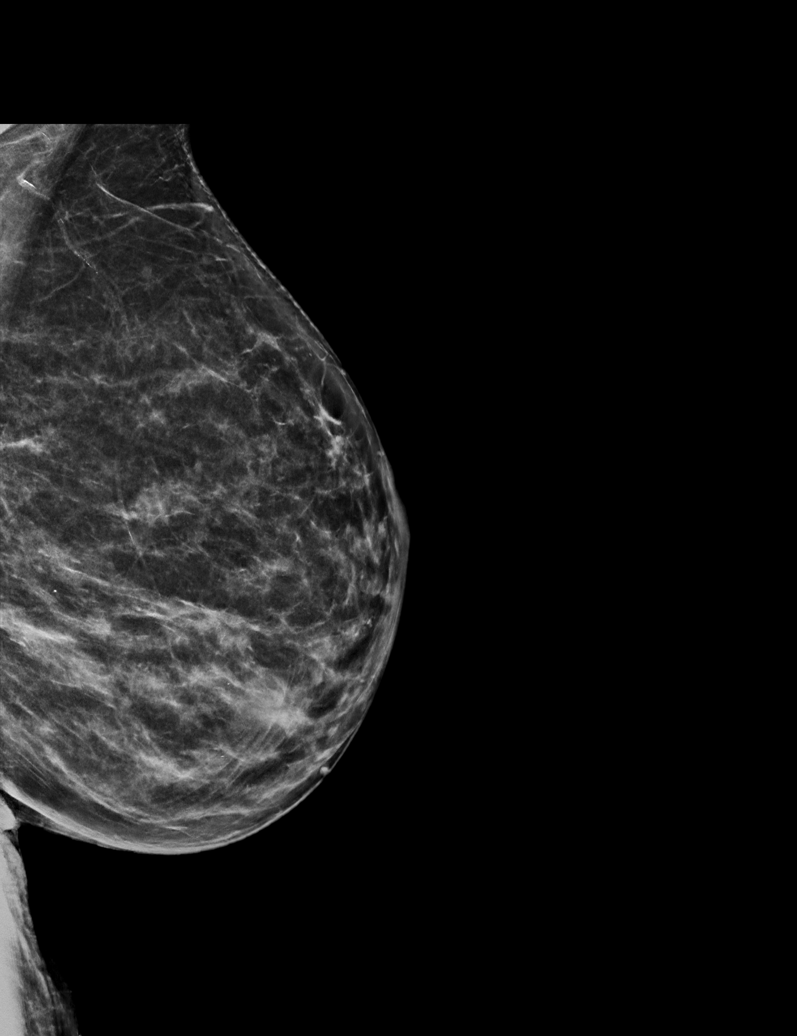

[R CC synth-2D]
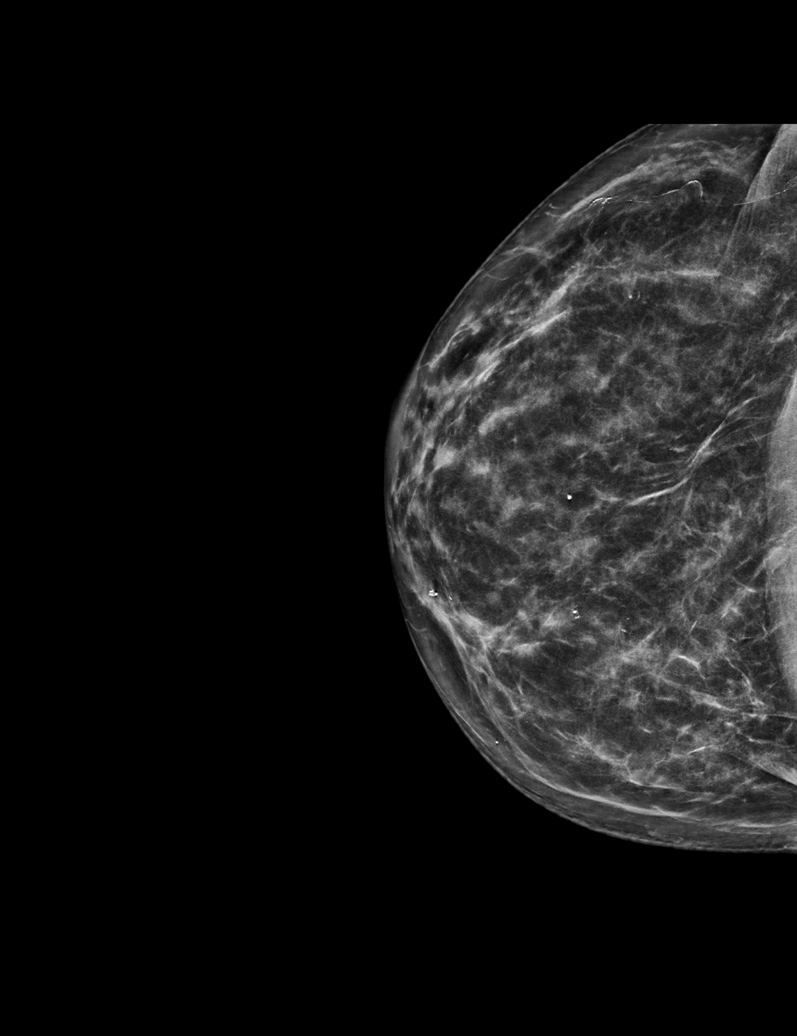

[L CC synth-2D]
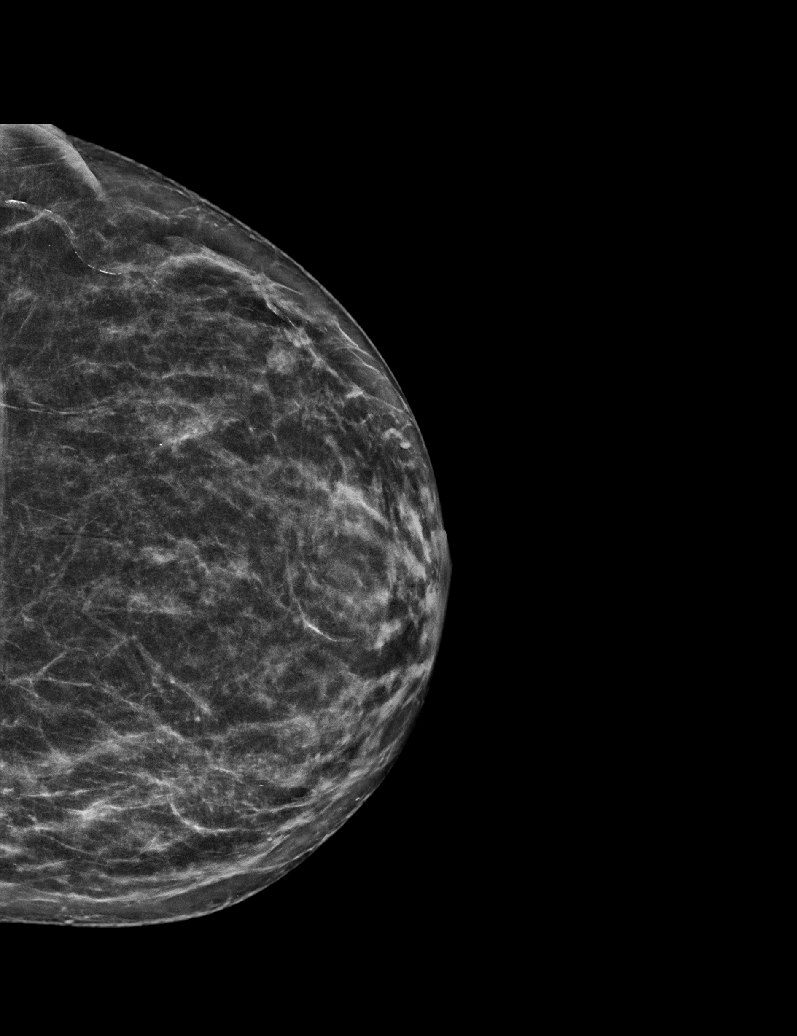

[L MLO synth-2D (2 of 2)]
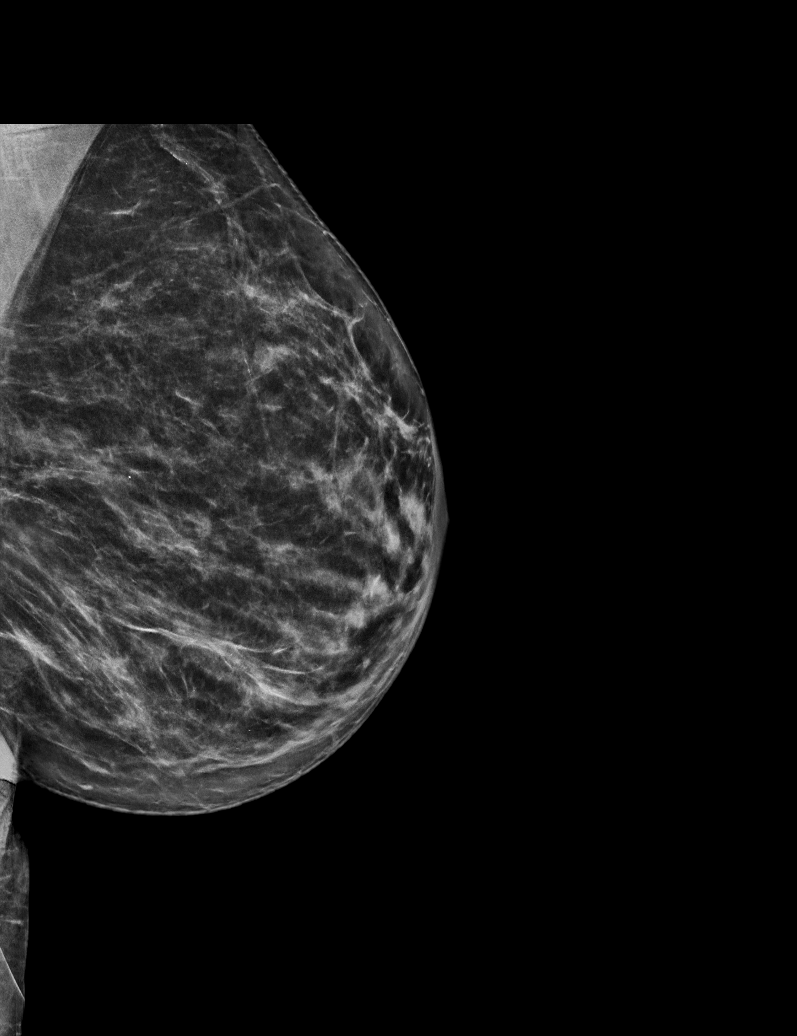

[R MLO synth-2D]
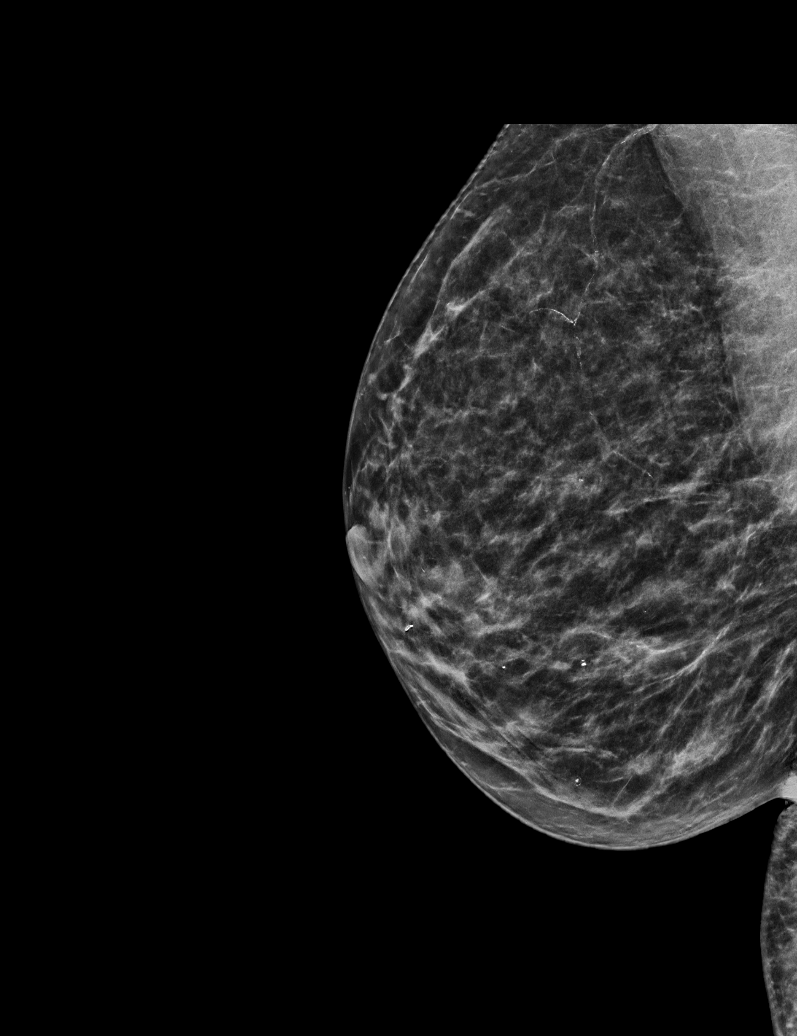

[R MLO tomo · tomo slice 30/59.0]
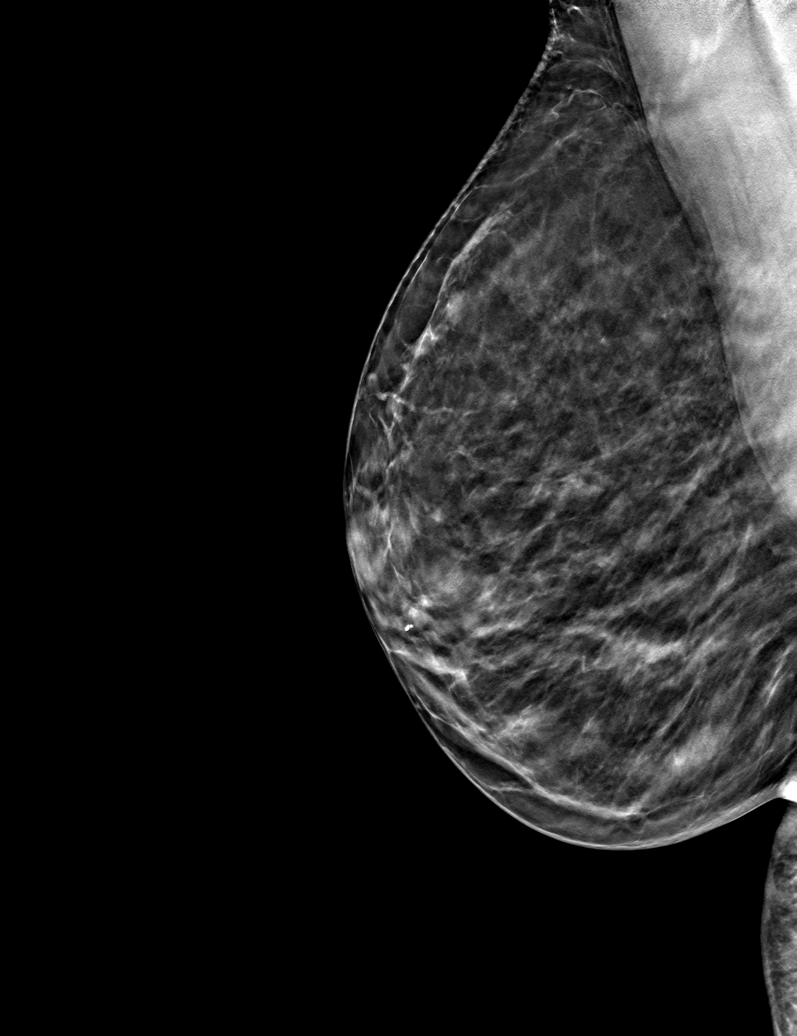

[6 of 30 positions shown; findings below may reference images not displayed]

ACR Breast Density Category c: The breast tissue is heterogeneously
dense, which may obscure small masses.
FINDINGS: There are no findings suspicious for malignancy. Images were
processed with CAD.
IMPRESSION: No mammographic evidence of malignancy. A result letter of this
screening mammogram will be mailed directly to the patient.

RECOMMENDATION:
Screening mammogram in one year. (Code:[id number]-[id number])

BI-RADS CATEGORY  1: Negative.

## 2022-06-23 ENCOUNTER — Other Ambulatory Visit: Payer: Self-pay | Admitting: Family

## 2022-06-23 DIAGNOSIS — Z1231 Encounter for screening mammogram for malignant neoplasm of breast: Secondary | ICD-10-CM

## 2022-08-08 ENCOUNTER — Ambulatory Visit
Admission: RE | Admit: 2022-08-08 | Discharge: 2022-08-08 | Disposition: A | Discharge status: Home / Self Care | Payer: Medicare HMO | Source: Ambulatory Visit | Attending: Family | Admitting: Family

## 2022-08-08 DIAGNOSIS — Z1231 Encounter for screening mammogram for malignant neoplasm of breast: Secondary | ICD-10-CM

## 2023-06-29 ENCOUNTER — Other Ambulatory Visit: Payer: Self-pay | Admitting: Family

## 2023-06-29 DIAGNOSIS — Z1231 Encounter for screening mammogram for malignant neoplasm of breast: Secondary | ICD-10-CM

## 2023-08-10 ENCOUNTER — Ambulatory Visit
Admission: RE | Admit: 2023-08-10 | Discharge: 2023-08-10 | Disposition: A | Discharge status: Home / Self Care | Payer: Medicare HMO | Source: Ambulatory Visit | Attending: Family | Admitting: Family

## 2023-08-10 DIAGNOSIS — Z1231 Encounter for screening mammogram for malignant neoplasm of breast: Secondary | ICD-10-CM

## 2024-06-05 ENCOUNTER — Other Ambulatory Visit: Payer: Self-pay | Admitting: Family

## 2024-06-05 DIAGNOSIS — Z1231 Encounter for screening mammogram for malignant neoplasm of breast: Secondary | ICD-10-CM

## 2024-08-12 ENCOUNTER — Ambulatory Visit
Admission: RE | Admit: 2024-08-12 | Discharge: 2024-08-12 | Disposition: A | Source: Ambulatory Visit | Attending: Family | Admitting: Family

## 2024-08-12 DIAGNOSIS — Z1231 Encounter for screening mammogram for malignant neoplasm of breast: Secondary | ICD-10-CM

## 2024-08-15 ENCOUNTER — Other Ambulatory Visit: Payer: Self-pay | Admitting: Family

## 2024-08-15 DIAGNOSIS — R928 Other abnormal and inconclusive findings on diagnostic imaging of breast: Secondary | ICD-10-CM

## 2024-08-26 ENCOUNTER — Ambulatory Visit
Admission: RE | Admit: 2024-08-26 | Discharge: 2024-08-26 | Disposition: A | Discharge status: Home / Self Care | Source: Ambulatory Visit | Attending: Family | Admitting: Family

## 2024-08-26 ENCOUNTER — Ambulatory Visit

## 2024-08-26 DIAGNOSIS — R928 Other abnormal and inconclusive findings on diagnostic imaging of breast: Secondary | ICD-10-CM
